# Patient Record
Sex: Male | Born: 1960 | Race: White | Hispanic: No | Marital: Single | Smoking: Former smoker
Health system: Southern US, Community
[De-identification: ages and names within clinical notes are randomized; demographics above are authoritative.]

## PROBLEM LIST (undated history)

## (undated) DIAGNOSIS — F419 Anxiety disorder, unspecified: Secondary | ICD-10-CM

## (undated) DIAGNOSIS — I10 Essential (primary) hypertension: Secondary | ICD-10-CM

## (undated) DIAGNOSIS — L409 Psoriasis, unspecified: Secondary | ICD-10-CM

## (undated) HISTORY — DX: Anxiety disorder, unspecified: F41.9

## (undated) HISTORY — PX: HERNIA REPAIR: SHX51

## (undated) HISTORY — DX: Psoriasis, unspecified: L40.9

---

## 1998-11-29 ENCOUNTER — Ambulatory Visit (HOSPITAL_BASED_OUTPATIENT_CLINIC_OR_DEPARTMENT_OTHER): Admission: RE | Admit: 1998-11-29 | Discharge: 1998-11-29 | Payer: Self-pay | Admitting: Orthopedic Surgery

## 1999-12-16 ENCOUNTER — Emergency Department (HOSPITAL_COMMUNITY): Admission: EM | Admit: 1999-12-16 | Discharge: 1999-12-16 | Payer: Self-pay | Admitting: Emergency Medicine

## 2001-04-07 ENCOUNTER — Ambulatory Visit (HOSPITAL_COMMUNITY): Admission: RE | Admit: 2001-04-07 | Discharge: 2001-04-07 | Payer: Self-pay | Admitting: General Practice

## 2001-04-07 ENCOUNTER — Encounter: Payer: Self-pay | Admitting: General Practice

## 2003-03-21 ENCOUNTER — Ambulatory Visit (HOSPITAL_COMMUNITY): Admission: RE | Admit: 2003-03-21 | Discharge: 2003-03-21 | Payer: Self-pay | Admitting: Orthopedic Surgery

## 2003-03-21 ENCOUNTER — Encounter: Payer: Self-pay | Admitting: Orthopedic Surgery

## 2004-10-04 ENCOUNTER — Emergency Department (HOSPITAL_COMMUNITY): Admission: EM | Admit: 2004-10-04 | Discharge: 2004-10-04 | Payer: Self-pay | Admitting: *Deleted

## 2004-10-11 ENCOUNTER — Ambulatory Visit: Payer: Self-pay | Admitting: Internal Medicine

## 2006-07-30 ENCOUNTER — Emergency Department (HOSPITAL_COMMUNITY): Admission: EM | Admit: 2006-07-30 | Discharge: 2006-07-30 | Payer: Self-pay | Admitting: Emergency Medicine

## 2006-09-01 ENCOUNTER — Inpatient Hospital Stay (HOSPITAL_COMMUNITY): Admission: EM | Admit: 2006-09-01 | Discharge: 2006-09-03 | Payer: Self-pay | Admitting: Emergency Medicine

## 2006-09-02 ENCOUNTER — Encounter (INDEPENDENT_AMBULATORY_CARE_PROVIDER_SITE_OTHER): Payer: Self-pay | Admitting: Cardiology

## 2008-07-11 ENCOUNTER — Inpatient Hospital Stay (HOSPITAL_COMMUNITY): Admission: EM | Admit: 2008-07-11 | Discharge: 2008-07-12 | Payer: Self-pay | Admitting: Emergency Medicine

## 2008-07-11 ENCOUNTER — Encounter (INDEPENDENT_AMBULATORY_CARE_PROVIDER_SITE_OTHER): Payer: Self-pay | Admitting: Neurology

## 2008-07-11 ENCOUNTER — Ambulatory Visit: Payer: Self-pay | Admitting: Internal Medicine

## 2009-08-19 ENCOUNTER — Emergency Department (HOSPITAL_COMMUNITY): Admission: EM | Admit: 2009-08-19 | Discharge: 2009-08-19 | Payer: Self-pay | Admitting: Emergency Medicine

## 2010-08-04 ENCOUNTER — Observation Stay (HOSPITAL_COMMUNITY)
Admission: EM | Admit: 2010-08-04 | Discharge: 2010-08-04 | Payer: Self-pay | Source: Home / Self Care | Attending: Emergency Medicine | Admitting: Emergency Medicine

## 2010-09-15 ENCOUNTER — Encounter: Payer: Self-pay | Admitting: Family Medicine

## 2010-11-04 LAB — POCT I-STAT, CHEM 8
Calcium, Ion: 1.17 mmol/L (ref 1.12–1.32)
Chloride: 103 mEq/L (ref 96–112)
Creatinine, Ser: 1.1 mg/dL (ref 0.4–1.5)
HCT: 45 % (ref 39.0–52.0)
Sodium: 142 mEq/L (ref 135–145)
TCO2: 30 mmol/L (ref 0–100)

## 2010-11-04 LAB — URINALYSIS, ROUTINE W REFLEX MICROSCOPIC
Bilirubin Urine: NEGATIVE
Glucose, UA: NEGATIVE mg/dL
Leukocytes, UA: NEGATIVE
Specific Gravity, Urine: 1.015 (ref 1.005–1.030)
pH: 6 (ref 5.0–8.0)

## 2010-11-04 LAB — URINE MICROSCOPIC-ADD ON

## 2010-11-25 LAB — POCT CARDIAC MARKERS
CKMB, poc: 3.9 ng/mL (ref 1.0–8.0)
Myoglobin, poc: 122 ng/mL (ref 12–200)
Myoglobin, poc: 168 ng/mL (ref 12–200)
Troponin i, poc: <0.05 ng/mL (ref 0.00–0.09)
Troponin i, poc: <0.05 ng/mL (ref 0.00–0.09)

## 2010-11-25 LAB — LIPASE, BLOOD: Lipase: 33 U/L (ref 11–59)

## 2010-11-25 LAB — COMPREHENSIVE METABOLIC PANEL
AST: 54 U/L — ABNORMAL HIGH (ref 0–37)
Creatinine, Ser: 1.18 mg/dL (ref 0.4–1.5)

## 2010-11-25 LAB — CBC: MCV: 87 fL (ref 78.0–100.0)

## 2011-01-07 NOTE — Consult Note (Signed)
NAME:  George Gallegos, George Gallegos NO.:  [id number]   MEDICAL RECORD NO.:  [id number]          PATIENT TYPE:  INP   LOCATION:  3743                         FACILITY:  [facility]   PHYSICIAN:  Gustavus Messing. Orlin Hilding, M.D.DATE OF BIRTH:  1961-05-01   DATE OF CONSULTATION:  07/11/2008  DATE OF DISCHARGE:                                 CONSULTATION   REASON FOR CONSULTATION:  Right-sided flushing/numbness.   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 on July 11, 2008, when he felt  sudden right-sided flushing in his right cheek all the way down the  right side of his body to his foot.  He did notice some mild weakness in  his right foot initially.  However, he did not have dysarthria,  dysphasia, visual defects, headache, dizziness, palpitations, or chest  pain during this time.  The patient denies any recent trauma and has not  had any cervical manipulation.  At the time of his evaluation, the  patient still felt different in his right lower extremity;  specifically he endorsed decreased sensation in his right lower  extremity.   PRIMARY MD:  Lillia Carmel, MD   PAST MEDICAL HISTORY:  1. Status post inguinal hernia repair.  2. Status post knee arthroscopy.  3. Atypical chest pain in January 2008.  Normal heart catheterization      with normal left ventricular function.  4. 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  5. Hypertension, diagnosed about 1 year ago by his primary care      physician, the patient not taking the medication that was      prescribed.   MEDICATIONS:  None.   ALLERGIES:  The patient endorses a non-anaphylactic reaction to an  antibiotic that he cannot recall.   FAMILY HISTORY:  His parents are  both alive and healthy.  He has no  family history of stroke, coronary artery disease, hypertension, or  diabetes.   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  He is married, lives in [place], and has 3 healthy  children.  He smokes about 4 cigars per year.  Drinks 5 glasses of  cabernet per week and denies any illicit drug use.   REVIEW OF SYSTEMS:  NEURO:  No history of headache or visual changes.  The patient explains that he gets very infrequent numbness or pain in  his right lower extremity secondary to his lumbar radiculopathy.  His  back pain associated symptoms have never involved any flushing of his  face or hemibody.  CARDIOVASCULAR:  No chest pain, palpitations, or  syncope.  PULMONARY:  No dyspnea, cough, or wheezing.  GI:  No nausea,  vomiting, abdominal pain, diarrhea, or rectal bleeding.  URINARY:  No  dysuria or hematuria.  MUSCULOSKELETAL:  No joint pain or swelling.  HEMATOLOGIC:  No weight loss, bruising, or bleeding.  ENDOCRINE:  No  polyuria, polydipsia, or polyphagia.   PHYSICAL EXAMINATION:  VITAL SIGNS:  Blood pressure 170/84, heart rate  88, respiratory rate 17, and O2 saturation 100% on room air.  GENERAL:  Middle-aged man in no acute distress.  HEENT:  Oropharynx is clear.  Uvula midline.  Tongue non-deviated.  NECK:  Supple.  No lymphadenopathy, thyromegaly, masses, or carotid  bruit.  LUNGS:  Clear to auscultation bilaterally with good air movement.  CARDIOVASCULAR:  Regular rate and rhythm.  No murmurs, rubs, or gallops.  ABDOMEN:  Bowel sounds positive.  Abdomen is soft, nontender, and  nondistended.  SKIN:  Warm and dry.  No lesions.  NEURO:  The patient is awake, alert, and oriented x3.  Cranial nerves II  through XII intact symmetrically.  Strength 5/5 in upper extremities and  lower extremities symmetrically.  Deep tendon reflexes 2+ throughout.  Minimal decrease in light touch and pain sensation in the  right calf  (this has improved since the patient was first evaluated).  Finger-to-  nose and heel-to-shin did not reveal any ataxia.  Gait not assessed.  NIH stroke scale 1.   LABORATORY DATA:  White blood count 7.9, hemoglobin 16.9 with an MCV of  87 and RDW of 13, and platelets 224.  PT 13.2, INR 1.0, and PTT 30.   IMAGING:  Head CT scan negative.   ASSESSMENT AND PLAN:  This is a 50 year old man with hypertension who  presented with a resolving stroke like symptoms.  Most likely, his  symptoms are related to a transient ischemic attack event.  Another  possibility would be multiple sclerosis, but at this time, we will work  him up as a TIA.  The patient will be admitted by a hospitalist service  and the stroke team will follow up with his results.  A brain and neck  MRI and MRA were ordered.  We will obtain a 2-D echo to rule out a  cardiac embolic etiology.  If we are unable to visualize the neck  vessels well, carotid Dopplers will be ordered.  For further risk factor  stratification, we will obtain a fasting lipid profile and a fasting  homocysteine level.  We will start the patient on aspirin.  He will need  blood pressure control as an outpatient, but it is important not to  emergently drop his blood pressure at this time given the possibility of  an acute event.  The patient does not meet the criteria for TPA given  that his symptoms were too mild and that they rapidly removed.      Olene Craven, M.D.  Electronically Signed      Gustavus Messing. Orlin Hilding, M.D.  Electronically Signed    MC/MEDQ  D:  07/11/2008  T:  07/11/2008  Job:  [id number]

## 2011-01-10 NOTE — Consult Note (Signed)
NAME:  George Gallegos, MOLDER NO.:  [id number]   MEDICAL RECORD NO.:  [id number]          PATIENT TYPE:  INP   LOCATION:  2623                         FACILITY:  [facility]   PHYSICIAN:  Georga Hacking, M.D.DATE OF BIRTH:  [birth year]   DATE OF CONSULTATION:  09/02/2006  DATE OF DISCHARGE:                                 CONSULTATION   I was asked to see this 50 year old male by Dr. Elliot Cousin for  evaluation of chest discomfort.  The patient has previously been in good  health but has had significant low back pain and lumbar disc disease.  He has been under significant situational stress over the past several  months, mainly related to business concerns.  About a month ago, he  experienced substernal chest pressure that was precipitated by emotional  events and was seen in the [facility] Emergency Room.  He has continued  to have substernal pressure that he relates to emotional upset or other  events.  The symptoms have occurred on and off over the past month, but  has escalated recently.  He had seen Dr. Prince Rome several days ago because  of low back pain and was treated conservatively and mentioned the chest  discomfort to him and was scheduled to see me electively.  He was mildly  hypertensive on admission.  He had a prolonged episode of chest  discomfort the day of admission, yesterday while at work, that was  precipitated by emotional upset.  He was admitted to the hospital and  had some transient ST depression on an EKG done earlier this morning and  last night that has since resolved.  The pain is not exertional in  nature although he has been inactive recently.   His past history is remarkable for recently diagnosed hypertension.  His  lipid status is unknown.  He has a history of possible irritable bowel  syndrome.   PAST SURGICAL HISTORY:  Knee arthroscopy and hernia surgery.   ALLERGIES:  None.   CURRENT MEDICATIONS:  None, although he is currently on  aspirin.   FAMILY HISTORY:  His father and mother are both living with no premature  history of heart disease.  He has two brothers and a sister who are  living with no significant cardiac history.   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  He is a nonsmoker and does not use alcohol to  excess.  He is married with three children.   REVIEW OF SYMPTOMS:  He describes himself as a highly anxious person and  has been this way most of his life.  He describes intense situational  stress and anxiety that will precipitate pressure and tachycardia.  He  has no skin problems.  He has no HEENT problems except for occasional  sinus drainage.  He denies dyspnea, cough, wheezing, or hemoptysis.  He  has no history of  reflux, ulcer, hematochezia or diarrhea.  There are no  urinary problems.  He has very mild arthritis. He has no history of  headaches or stroke.   PHYSICAL EXAMINATION:  GENERAL:  He is an anxious tall male who is currently in no acute  distress.  VITAL SIGNS:  Blood pressure 140/80, pulse was 70.  SKIN:  Warm and dry.  HEENT:  Unremarkable.  LUNGS:  Clear to A&P.  CARDIOVASCULAR:  Normal S1 and S2, there is no S3, S4, or murmur.  ABDOMEN:  Soft, nontender, no mass, hepatosplenomegaly, or aneurysm.  EXTREMITIES:  Femoral and distal pulses are 2+.   12 lead EKG showed minor ST depression in the anterior leads on  admission that has resolved by EKG this afternoon.  His chemistry panel  was normal.  CPK total 244 with MB and troponins negative.   IMPRESSION:  1. Chest discomfort with some atypical features, rule out coronary      artery disease.  2. Anxiety.  3. Hypertension.   RECOMMENDATIONS:  The patient is highly anxious but has had continued  chest discomfort with emergency  room visits for chest pain in February  2006 and December 6.  I discussed the options for workup with the  patient and his wife.  We discussed whether to have cardiac  catheterization for definitive diagnosis of coronary artery disease  versus non-invasive stress testing with profusion imaging.  I discussed  the benefits and risks of each and because of the patient's anxiety and  multiple emergency room visits for chest discomfort, we have decided to  go ahead with catheterization.  The procedure was discussed with him  fully including risks of myocardial infarction, stroke, or death, and he  is agreeable to proceed.  The possibility of same day stenting or  intervention was discussed with him at the same time and will be done by  a colleague if necessary.      Georga Hacking, M.D.  Electronically Signed     WST/MEDQ  D:  09/02/2006  T:  09/02/2006  Job:  [id number]   cc:   Lillia Carmel, M.D.  Elliot Cousin, M.D.

## 2011-01-10 NOTE — Discharge Summary (Signed)
George Gallegos, George Gallegos                ACCOUNT NO.:  [id number]   MEDICAL RECORD NO.:  [id number]          PATIENT TYPE:  INP   LOCATION:  2623                         FACILITY:  [facility]   PHYSICIAN:  Hind I Elsaid, MD      DATE OF BIRTH:  Dec 25, 1960   DATE OF ADMISSION:  09/01/2006  DATE OF DISCHARGE:  09/03/2006                               DISCHARGE SUMMARY   PRIMARY CARE PHYSICIAN:  Unassigned.   DISCHARGE DIAGNOSIS:  Atypical chest pain status post cardiac cath with  the result  negative.   DISCHARGE MEDICATIONS:  Protonix 40 mg p.o. daily.   PROCEDURES:  1. X-ray on January 8:  No active cardiopulmonary disease.  2. January 10:  Left heart catheterization with coronary angiogram and      ventriculogram with the result that mitral valve was normal.  Left      ventricle appears normal in size.  Ejection fraction 70%.  Coronary      arteries arise and they appear normal.  There is no complication in      the coronaries noted.  Left main coronary artery is normal.  Left      anterior descending is large vessel that extends around the apex      and a single diagonal branch with 2 smaller diagonal branches      noted.  There is no disease noted.  Circumflex coronary artery has      the terminal branch and free of disease.  The right coronary artery      is a large, dominant vessel containing posterior descending,      posterior lateral branch and is free of disease.   CONSULTATIONS:  Cardio consult done be Dr. Viann Fish.   HISTORY OF PRESENT ILLNESS:  A 50 year old male with no significant past  medical history who presented on January 8 who has had chest pressure in  the mid sternum relieved by rest and responsive to nitroglycerin  sublingual.  The patient was admitted to telemetry.  Cardiac enzymes, CK-  MB, and troponin, and echo were ordered.  Cardiology was consulted by  Dr. Donnie Aho where he recommended cardiac cath.  The patient underwent  cardiac cath with the result  above, which is normal.  The patient has no  complications as per the cardiac cath.  There is no hematoma at the cath  site, and basilar pulses are intact.  So the patient has no evidence of  coronary artery disease with normal left ventricular ejection fraction.  I suspect that the pain may lead you to reflux or anxiety, so we will  discharge the patient on Protonix 40 mg p.o. daily and to follow with  the primary care physician for further recommendations if the chest pain  continues.      Hind Bosie Helper, MD  Electronically Signed     HIE/MEDQ  D:  09/03/2006  T:  09/04/2006  Job:  [id number]

## 2011-01-10 NOTE — H&P (Signed)
NAME:  George Gallegos, George Gallegos NO.:  [id number]   MEDICAL RECORD NO.:  [id number]          PATIENT TYPE:  INP   LOCATION:  2623                         FACILITY:  [facility]   PHYSICIAN:  Gardiner Barefoot, MD    DATE OF BIRTH:  Mar 29, 1961   DATE OF ADMISSION:  09/01/2006  DATE OF DISCHARGE:                              HISTORY & PHYSICAL   PRIMARY CARE PHYSICIAN:  Unassigned.   HISTORY OF PRESENT ILLNESS:  George Gallegos, a 50 year old male, with no  significant past medical history, who presents today with chest pressure  in mid sternum, relieved both by rest or along with activity that has  been coming and going for the last month.  Of note, he came to the  emergency room about 1 month ago for the same and it was recommended  that he follow up with his physician, but he failed to do so.  Patient  reports that both of these episodes came at times of increased stress,  which has exacerbated this.  The patient states that he has poor living  situation and has had extreme amounts of stress, for which he holds it  in.  The patient denies any smoking or cocaine use.   PAST MEDICAL HISTORY:  No know past medical history and no medicines.   SOCIAL HISTORY:  Patient lives in an apartment and denies any smoking,  drinking or drugs.   FAMILY HISTORY:  No early cardiac events in the family.   ALLERGIES:  NO KNOWN DRUG ALLERGIES.   REVIEW OF SYSTEMS:  A 12-point review of systems was obtained and was  negative other than in history of present illness.   PHYSICAL EXAMINATION:  VITAL SIGNS:  Temperature is 98.3.  Heart rate is  89.  Blood pressure is 171/86.  Respirations 18.  Oxygen is 97% on room  air.  GENERAL:  The patient is anxious, alert and appears in no acute  distress.  HEENT:  Anicteric.  Mucous membranes moist.  CARDIOVASCULAR:  Regular rate and rhythm with no murmurs, rubs or  gallops.  LUNGS:  Clear to auscultation bilaterally.  ABDOMEN:  Soft, nontender, nondistended.   Positive bowel sounds.  No  hepatosplenomegaly.  EXTREMITIES:  No clubbing, cyanosis or edema.   EKG with nonspecific T-inversion in 1 lead.   LABS:  Initial troponin is less than 0.05, CK-MB is 1.7, creatinine is  1.1, sodium is 138, potassium 3.9, chloride 105, glucose 95, BUN 12.   ASSESSMENT/PLAN:  1. Atypical chest pressure.  Unsure if this is cardiac in origin or      more likely feel it is related to the patient's anxiety as it is      noticeably relieved as I was telling the patient and reassuring him      that we have no evidence for heart attack at the present time.      Patient admits that he has significant stress and has very poor      coping skills with this stress.  Nevertheless, as the patient is      having chest pain, we  will have the patient ruled out overnight and      monitor him on tele and also we will do a stress test tomorrow.      The patient was started on a beta blocker recently, but has not      filled his prescription and will hold on the beta blocker tonight      as we want to check a stress test tomorrow and get his heart rate      up at that time.  2. Anxiety.  The patient would certainly benefit from some outpatient      psychotherapy and will need a referral for that.  3. Disposition.  The patient is a full code and will need to be      referred to a primary care physician.      Gardiner Barefoot, MD  Electronically Signed     RWC/MEDQ  D:  09/01/2006  T:  09/02/2006  Job:  [id number]

## 2011-01-10 NOTE — Cardiovascular Report (Signed)
NAME:  George Gallegos, George Gallegos NO.:  [id number]   MEDICAL RECORD NO.:  [id number]          PATIENT TYPE:  INP   LOCATION:  2623                         FACILITY:  [facility]   PHYSICIAN:  Georga Hacking, M.D.DATE OF BIRTH:  1961/02/06   DATE OF PROCEDURE:  09/03/2006  DATE OF DISCHARGE:                            CARDIAC CATHETERIZATION   PROCEDURE:  Left heart catheterization with coronary angiograms and left  ventriculogram.   HISTORY:  A 50 year old male admitted with recurrent substernal pressure  precipitated by emotional events.  He had bradycardia and hypotension  the night prior to the catheterization also in association with chest  discomfort that may have been vagal in origin.   COMMENTS ABOUT THE PROCEDURE:  The procedure was done through the right  femoral artery using a single anterior needle wall stick with 6-French  catheters over a 6-French sheath.  He tolerated the procedure well  without any complications.  Had good hemostasis, peripheral pulses noted  at the end of the procedure.   HEMODYNAMIC DATA:  Aorta post contrast 130/76, LV postcontrast 130/612.   ANGIOGRAPHIC DATA:  Left ventriculogram:  Performed in the 30 degrees  RAO projection.  The aortic valve is normal.  The mitral valve was  normal.  The left ventricle appears normal in size.  Estimated ejection  fraction is 70%.  Coronary arteries arise and distribute normally.  There is no calcification in the coronaries noted.  The left main  coronary artery is normal.  The left anterior descending is a large  vessel that extends around the apex and supplies a single diagonal  branch with two smaller diagonal branches noted.  There is no disease  noted.  Circumflex coronary artery has a is two marginal branches and is  free of disease.  The right coronary artery is a large dominant vessel  containing posterior descending posterolateral branch and is free of  disease.   IMPRESSION:  1. Normal  coronary arteries.  2. Normal left ventricular function.   RECOMMENDATIONS:  Evaluation for other sources of chest discomfort.  Treatment of anxiety.  Consideration of treatment for reflux.  Check  echocardiogram.      Georga Hacking, M.D.  Electronically Signed     WST/MEDQ  D:  09/03/2006  T:  09/03/2006  Job:  [id number]   cc:   Lillia Carmel, M.D.  Elliot Cousin, M.D.

## 2011-01-10 NOTE — Discharge Summary (Signed)
NAME:  George Gallegos, George Gallegos NO.:  [id number]   MEDICAL RECORD NO.:  [id number]          PATIENT TYPE:  INP   LOCATION:  3743                         FACILITY:  [facility]   PHYSICIAN:  Chauncey Reading, D.O.  DATE OF BIRTH:  1960-12-24   DATE OF ADMISSION:  07/11/2008  DATE OF DISCHARGE:  07/12/2008                               DISCHARGE SUMMARY   DISCHARGE DIAGNOSES:  1. Transient ischemic attack, resolved.  2. Chronic low back pain secondary to L5-S1 disk herniation.  3. Borderline hypertension.  4. Clean cardiac catheterization in January 2008, showing ejection      fraction of 70% and normal coronaries.   DISCHARGE MEDICATIONS:  1. Aspirin 81 mg p.o. daily.  2. Fish oil tablets as previously instructed.   DISPOSITION AND FOLLOWUP:  The patient was discharged home in stable  condition with no residual neurological deficits.  The patient is to  follow up with his primary care George Gallegos, Dr. Prince Gallegos of [facility] on Wednesday, August 09, 2008, at 8:45 a.m.  At that  point, he should have his cholesterol rechecked.  His blood pressure  should also be assessed and medications added as deemed necessary for  further lowering of his cholesterol and hypertension.  In addition, a  hypercoagulable workup and vasculitis workup was pending upon the  patient's discharge, and this will need to be followed up on as an  outpatient.  Furthermore, the patient is to call Dr. Pearlean Gallegos from  Neurology at [phone] for outpatient followup for his TIA.   PROCEDURES PERFORMED:  1. CT of the head without contrast performed on July 11, 2008,      revealed no visible infarct or hemorrhage.  2. An MRI and MRA of the head and neck without contrast performed on      July 11, 2008, showed normal ventricles, no infarct or mass      lesions, no fluid collection.  The carotid bifurcation is widely      patent bilaterally, and there is no carotid stenosis.  Both      vertebral  arteries are patent without significant stenosis.  3. A 2-D echocardiogram performed on July 11, 2008, revealed      normal left ventricular function with ejection fraction of 60-70%.      Left ventricular wall thickness was mildly increased.  Left      ventricular diastolic function was normal.  The possibility of a      patent foramen ovale could not be excluded based on this study.      There were no significant interval changes when compared to a prior      study performed on August 25, 2006.   CONSULTATIONS:  George P. George Brownie, MD, from Neurology.   BRIEF ADMITTING HISTORY AND PHYSICAL:  George Gallegos is a 50 year old white  male with past medical history of questionable borderline hypertension,  chronic lower back pain secondary to L5-S1 disk herniation, who presents  with right-sided numbness and weakness that started abruptly at 10 a.m.  on the morning of admission while he was working on his computer.  States that symptoms started with a numb feeling extending from the  right side of his face down to his right lower leg, soon followed by  right-sided weakness.  The patient denied any inciting events such as  chest pain, palpitations, shortness of breath, and denies any subsequent  loss of consciousness, headache, vision changes, incontinence, or speech  difficulties.  The patient drove himself to the emergency room and once  he came to the ER, his symptoms gradually resolved.  The patient now  states that he is back to baseline and denies any recent fevers, chills,  cough, abdominal pain, nausea, vomiting, diarrhea, dysuria, or heat or  cold intolerance.   PHYSICAL EXAMINATION:  VITAL SIGNS:  On admission, the patient had the  following vitals; temperature of 97.8, blood pressure of 163/102, pulse  of 92, respiratory rate of 18, and sating 99% on room air.  GENERAL:  He was in no acute distress, was alert and oriented x3.  HEENT:  Extraocular motions to be intact.  Anicteric  sclerae.  Pupils  are equal, round, and reactive to light.  Oropharynx, oral cavity was  clear with moist mucous membranes.  NECK:  Supple with no lymphadenopathy or carotid bruits.  RESPIRATORY:  Clear to auscultation bilaterally.  CARDIOVASCULAR:  Regular rate and rhythm with normal S1 and S2, and no  murmurs, rubs, or gallops.  GI:  Positive bowel sounds and soft, nontender, and nondistended  abdomen.  EXTREMITIES:  No clubbing, cyanosis, or edema and no calf tenderness.  SKIN:  Warm and dry.  NEUROLOGIC:  The patient to be alert and oriented x3.  Cranial nerves II  through XII intact, 5/5 strength and normal sensation to light touch  throughout.  Deep tendon reflexes 2+ and normal cerebellar function.   LABORATORIES ON ADMISSION:  White blood cell count of 7.9 with ANC of  6.2, hemoglobin of 16.9, MCV 87, and platelets 224.  Sodium 141,  potassium 3.8, chloride of 105, bicarb 25, BUN 10, creatinine 1.18, and  glucose 107.  Total bilirubin 1.4, alk phos 48, AST 49, ALT 41, total  protein 7.4, albumin 4.6, and calcium 9.6.  PT 13.2, PTT 30.0, and INR  1.0.  Urine drug screen was negative.  ESR was 4.0.  Total CK was 608,  CK-MB 5.1, and troponin 0.01.   HOSPITAL COURSE:  Transient ischemic attack.  While in the emergency  department, the patient received a CT of the head to rule out any acute  hemorrhages and infarcts.  The patient's symptoms of right-sided  weakness and paresthesias resolved within 30 minutes of reaching the  emergency department, lasting for a total of less than 1 hour and never  recurred throughout the course of his admission.  The patient was  admitted to a telemetry bed for overnight observation and TIA workup.  He received an essentially normal MRI and MRA of the head and neck and  revealed no intracranial lesions or vascular pathology.  A 2-D  echocardiogram was performed, which showed normal ventricular and valve  functioning, and no source of embolic  stroke.  To evaluate for  cerebrovascular risk-factors, a fasting lipid panel and hemoglobin A1c  were obtained.  The lipid panel showed a total cholesterol of 180, LDL  of 119, HDL of 31, and triglycerides of 149.  His hemoglobin A1c was  5.2.  Neurology was consulted and after evaluation of the patient, it  was recommended that he begin daily aspirin therapy as well as over-the-  counter fish oil tablets to help lower his cholesterol levels.  On  admission, the patient's blood pressure was slightly elevated in the  160s.  However, overnight his blood pressure dropped to normal without  any intervention and remained normal throughout the course of his  hospitalization.  Because of the patient's relatively young age and lack  of significant risk factors, a hypercoagulable as well as vasculitis  workup was started in the hospital; however, the results of these were  largely pending upon discharge and will need to be followed up on as an  outpatient.  By the day of discharge, the patient had no more symptoms  of paresthesias or weakness, and was back to his baseline state of  health.   DISCHARGE LABORATORIES AND VITAL SIGNS:  On discharge, the patient had a  temperature of 98.1, heart rate of 60, respirations of 18, blood  pressure of 128/61, and saturation of 99% on room air.   The patient also had the following labs on the day of discharge; ESR of  4.0, CRP of 0.1, serum homocysteine of 10.3, and hemoglobin A1c of 5.2.  ANA was negative.  C3 and C4 levels were within normal limits.  Total  cholesterol was 180, triglycerides 149, HDL of 31, and LDL of 119.  Hypercoagulable panel was pending.      Lucy Antigua, MD  Electronically Signed      Chauncey Reading, D.O.  Electronically Signed    RK/MEDQ  D:  07/14/2008  T:  07/15/2008  Job:  [id number]   cc:   Lillia Carmel, M.D.

## 2011-05-27 LAB — COMPREHENSIVE METABOLIC PANEL
AST: 32
AST: 49 — ABNORMAL HIGH
Albumin: 3.8
Albumin: 4.6
Alkaline Phosphatase: 68
CO2: 25
Calcium: 9.2
Creatinine, Ser: 1.15
Creatinine, Ser: 1.18
GFR calc Af Amer: >60
Glucose, Bld: 107 — ABNORMAL HIGH
Potassium: 3.8
Sodium: 141
Total Protein: 6.6

## 2011-05-27 LAB — HEMOGLOBIN A1C: Mean Plasma Glucose: 103

## 2011-05-27 LAB — CK TOTAL AND CKMB (NOT AT ARMC)
CK, MB: 5.1 — ABNORMAL HIGH
Relative Index: 0.8
Relative Index: 1
Total CK: 608 — ABNORMAL HIGH

## 2011-05-27 LAB — DIFFERENTIAL
Basophils Absolute: 0
Eosinophils Relative: 0
Lymphs Abs: 1.2
Neutrophils Relative %: 78 — ABNORMAL HIGH

## 2011-05-27 LAB — CBC
HCT: 48.5
Hemoglobin: 16.9
MCHC: 34.9
MCHC: 35.3
MCV: 86.6
MCV: 87
Platelets: 193
Platelets: 224
RDW: 12.8
WBC: 7.6
WBC: 7.9

## 2011-05-27 LAB — BETA-2-GLYCOPROTEIN I ABS, IGG/M/A
Beta-2 Glyco I IgG: 5 U/mL (ref <20)
Beta-2-Glycoprotein I IgA: <4 U/mL (ref <10)

## 2011-05-27 LAB — RAPID URINE DRUG SCREEN, HOSP PERFORMED
Opiates: NOT DETECTED
Tetrahydrocannabinol: NOT DETECTED

## 2011-05-27 LAB — TROPONIN I
Troponin I: 0.01
Troponin I: 0.01

## 2011-05-27 LAB — C4 COMPLEMENT: Complement C4, Body Fluid: 39

## 2011-05-27 LAB — LUPUS ANTICOAGULANT PANEL
DRVVT: 47.7 — ABNORMAL HIGH (ref 36.1–47.0)
Lupus Anticoagulant: NOT DETECTED
dRVVT Incubated 1:1 Mix: 39.9 (ref 36.1–47.0)

## 2011-05-27 LAB — LIPID PANEL
HDL: 31 — ABNORMAL LOW
LDL Cholesterol: 119 — ABNORMAL HIGH
Triglycerides: 149
VLDL: 30

## 2011-05-27 LAB — C3 COMPLEMENT: C3 Complement: 122

## 2011-05-27 LAB — PROTEIN C ACTIVITY: Protein C Activity: 144 % — ABNORMAL HIGH (ref 75–133)

## 2011-05-27 LAB — PROTEIN C, TOTAL: Protein C, Total: 97 % (ref 70–140)

## 2011-05-27 LAB — FACTOR 5 LEIDEN

## 2011-05-27 LAB — C-REACTIVE PROTEIN: CRP: 0.1 — ABNORMAL LOW (ref <0.6)

## 2011-05-27 LAB — HOMOCYSTEINE: Homocysteine: 11.8

## 2011-05-27 LAB — CARDIOLIPIN ANTIBODIES, IGG, IGM, IGA: Anticardiolipin IgG: <7 — ABNORMAL LOW (ref <11)

## 2014-12-13 ENCOUNTER — Other Ambulatory Visit: Payer: Self-pay | Admitting: Orthopedic Surgery

## 2014-12-13 DIAGNOSIS — M25562 Pain in left knee: Secondary | ICD-10-CM

## 2014-12-19 ENCOUNTER — Ambulatory Visit
Admission: RE | Admit: 2014-12-19 | Discharge: 2014-12-19 | Disposition: A | Discharge status: Home / Self Care | Payer: BLUE CROSS/BLUE SHIELD | Source: Ambulatory Visit | Attending: Orthopedic Surgery | Admitting: Orthopedic Surgery

## 2014-12-19 DIAGNOSIS — M25562 Pain in left knee: Secondary | ICD-10-CM

## 2016-03-12 IMAGING — MR MR KNEE*L* W/O CM
4 of 6 series · 18 of 40 positions shown · non-contrast
Comparison: Report only from previous MRI 04/07/2001.

CLINICAL DATA: Left knee pain following lifting injury 3 weeks ago.
History of knee surgery in 4333. Initial encounter.

EXAM:
MRI OF THE LEFT KNEE WITHOUT CONTRAST
TECHNIQUE: Multiplanar, multisequence MR imaging of the knee was performed. No
intravenous contrast was administered.

[Series 3: PD · axial · 4.0mm · 0.31mm/px · z∈[-59,+61]mm · 9 of 26 slices shown (1 of 2)]
[im 1/26]
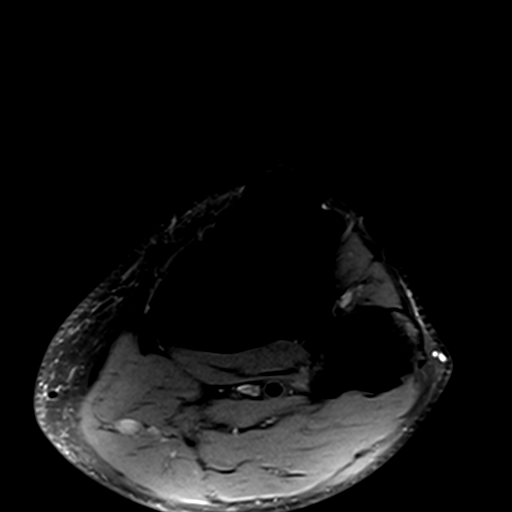
[im 4/26]
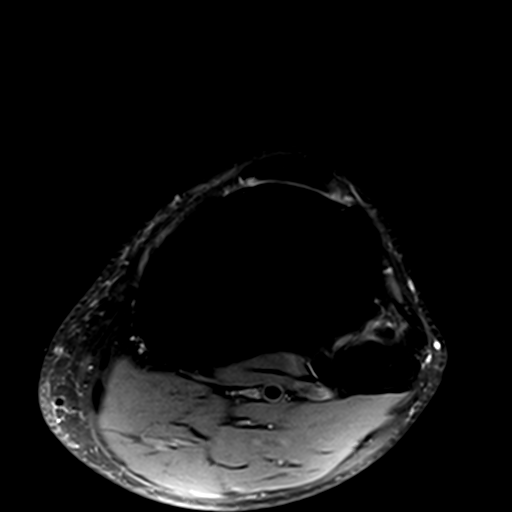
[im 7/26]
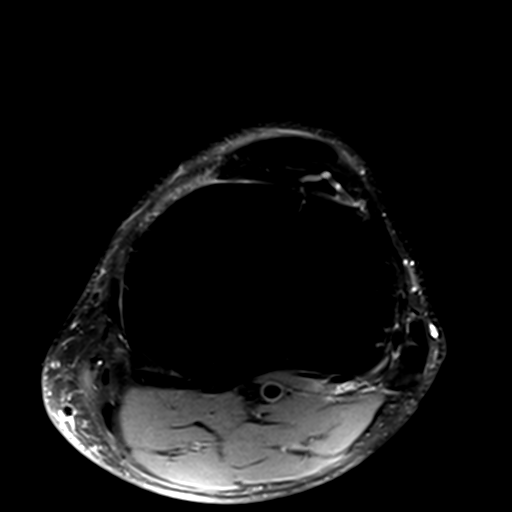
[im 10/26]
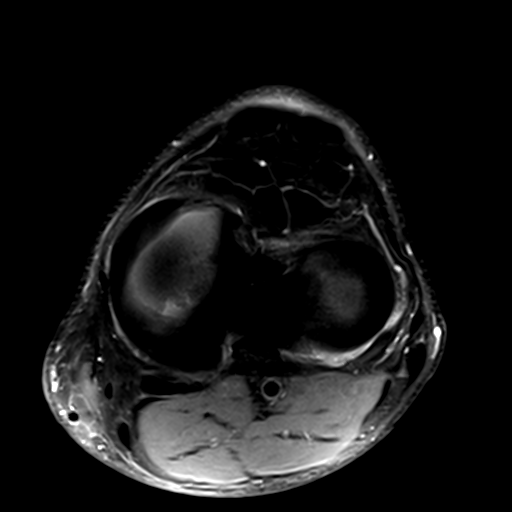
[im 13/26]
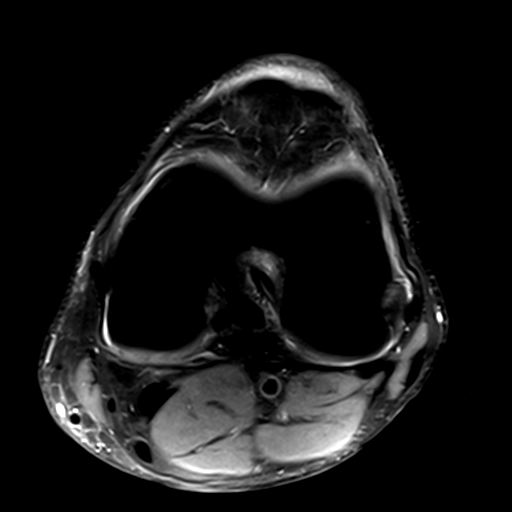
[im 16/26]
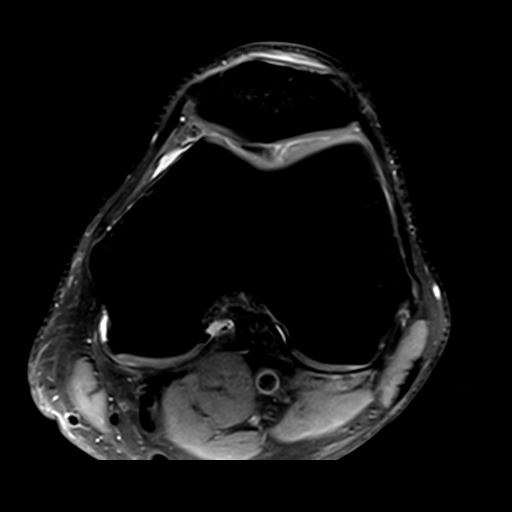
[im 19/26]
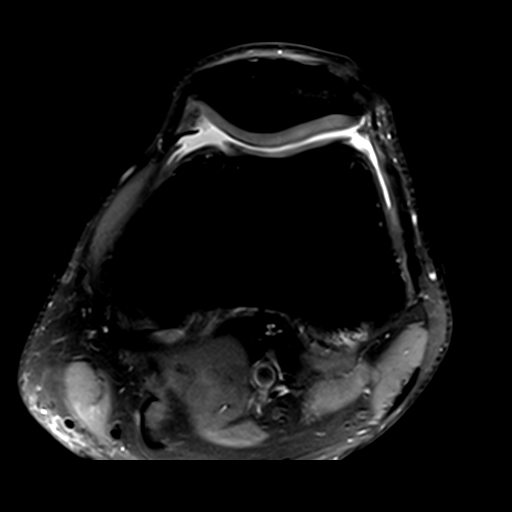
[im 22/26]
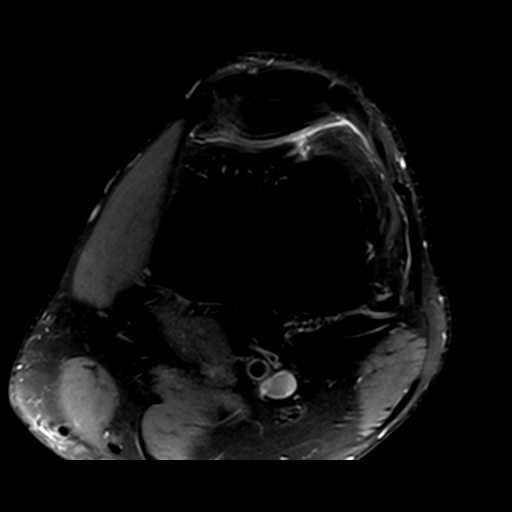
[im 26/26]
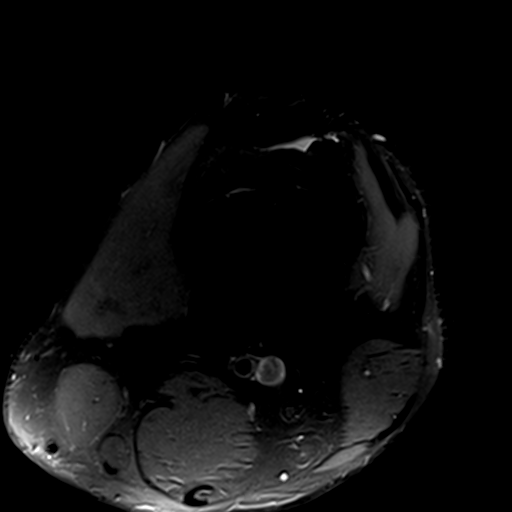

[Series 4: PD · coronal · 4.0mm · 0.31mm/px · 3 of 22 slices shown (2 of 2)]
[im 4/22]
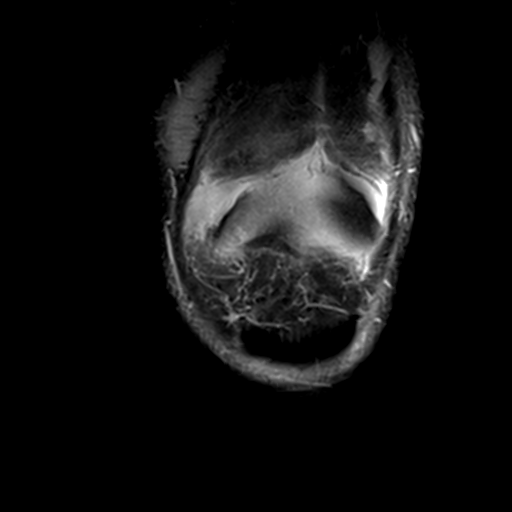
[im 11/22]
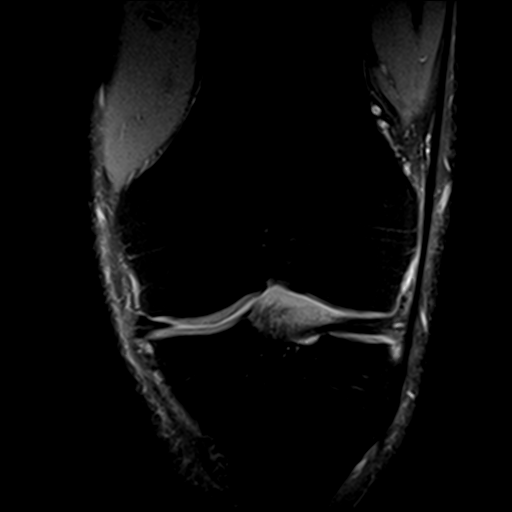
[im 18/22]
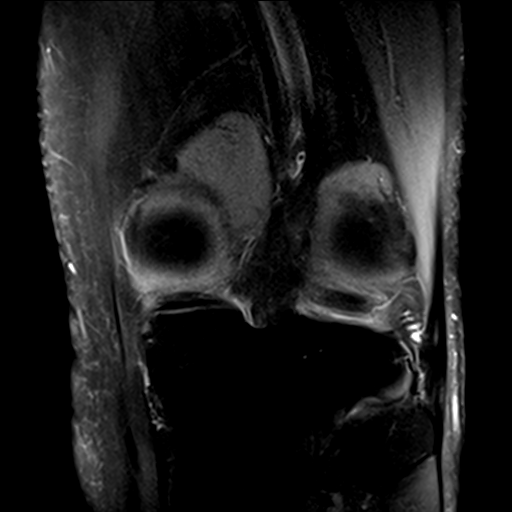

[Series 5: T2 · coronal · 4.0mm · 0.31mm/px · 3 of 22 slices shown]
[im 4/22]
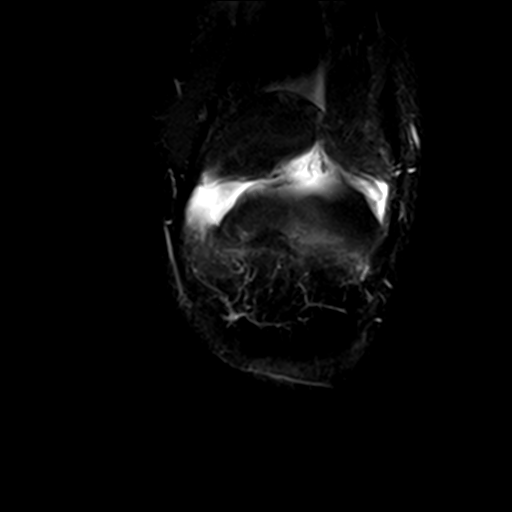
[im 11/22]
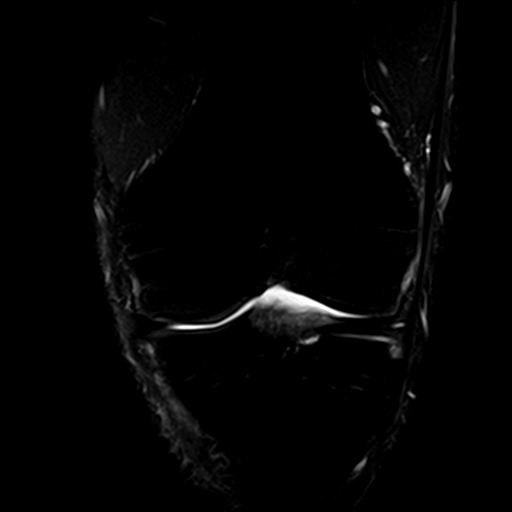
[im 18/22]
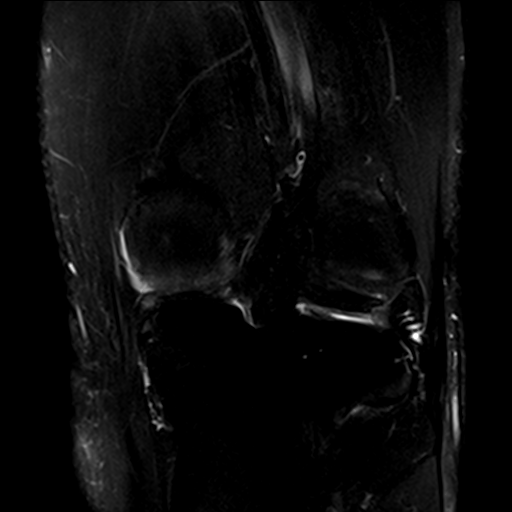

[Series 6: PD fat-sat · sagittal · 4.0mm · 0.31mm/px · 3 of 23 slices shown]
[im 4/23]
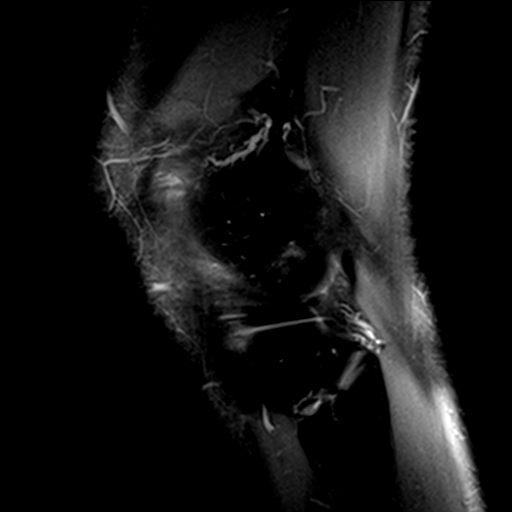
[im 12/23]
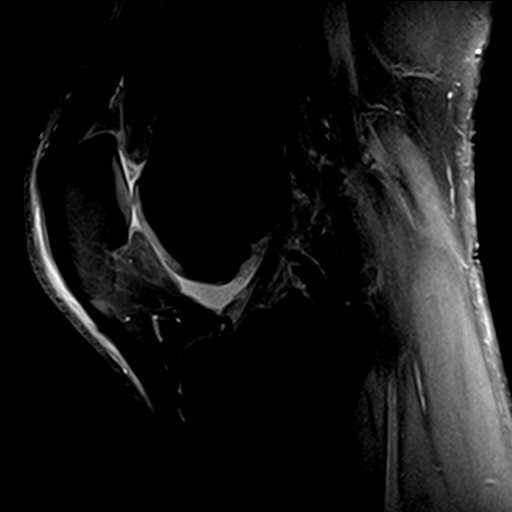
[im 19/23]
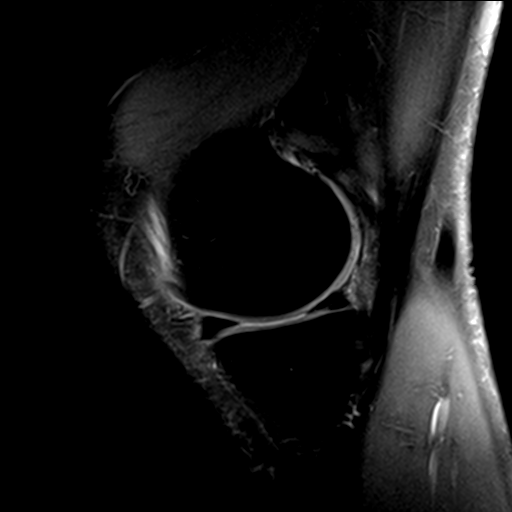

[18 of 40 positions shown; findings below may reference images not displayed]

FINDINGS: MENISCI

Medial meniscus: There is some free edge and undersurface
irregularity of the posterior horn which was described previously
and may be postsurgical in etiology or reflect a small tear. There
is no displaced meniscal fragment. The meniscal root is intact.

Lateral meniscus: Slightly discoid configuration. There is a tiny
focus of free edge irregularity at the junction of the anterior horn
and body, best seen on sagittal image 18 and coronal image 9. No
displaced meniscal fragment.

LIGAMENTS

Cruciates:  Intact.

Collaterals:  Intact.

CARTILAGE

Patellofemoral: The patellar cartilage is well preserved. There is
mild fissuring of the trochlear cartilage.

Medial: Mild chondral thinning and surface irregularity posteriorly
over the medial femoral condyle. No full-thickness defect.

Lateral:  Preserved.

Joint:  Minimal joint fluid.  No loose bodies observed.

Popliteal Fossa:  Unremarkable. No significant Baker's cyst.

Extensor Mechanism: Intact. There is mild prepatellar subcutaneous
edema. There is a mildly thickened medial patellar plica.

Bones:  No significant extra-articular osseous findings.
IMPRESSION: 1. Free edge irregularity of both menisci, similar to previous
report and potentially postsurgical in etiology. There is a possible
small undersurface tear of the posterior horn of the medial meniscus
which was also previously described. No displaced meniscal fragment.
2. Mild medial compartment degenerative chondrosis and fissuring of
the trochlear cartilage. No acute osseous findings.
3. Intact knee ligaments.

## 2016-11-19 ENCOUNTER — Encounter (HOSPITAL_BASED_OUTPATIENT_CLINIC_OR_DEPARTMENT_OTHER): Payer: Self-pay

## 2016-11-19 ENCOUNTER — Emergency Department (HOSPITAL_BASED_OUTPATIENT_CLINIC_OR_DEPARTMENT_OTHER)
Admission: EM | Admit: 2016-11-19 | Discharge: 2016-11-19 | Disposition: A | Discharge status: Home / Self Care | Payer: 59 | Attending: Emergency Medicine | Admitting: Emergency Medicine

## 2016-11-19 DIAGNOSIS — Y929 Unspecified place or not applicable: Secondary | ICD-10-CM | POA: Diagnosis not present

## 2016-11-19 DIAGNOSIS — Y939 Activity, unspecified: Secondary | ICD-10-CM | POA: Diagnosis not present

## 2016-11-19 DIAGNOSIS — S0180XA Unspecified open wound of other part of head, initial encounter: Secondary | ICD-10-CM

## 2016-11-19 DIAGNOSIS — Y999 Unspecified external cause status: Secondary | ICD-10-CM | POA: Diagnosis not present

## 2016-11-19 DIAGNOSIS — I10 Essential (primary) hypertension: Secondary | ICD-10-CM | POA: Insufficient documentation

## 2016-11-19 DIAGNOSIS — X58XXXA Exposure to other specified factors, initial encounter: Secondary | ICD-10-CM | POA: Diagnosis not present

## 2016-11-19 DIAGNOSIS — Z87891 Personal history of nicotine dependence: Secondary | ICD-10-CM | POA: Insufficient documentation

## 2016-11-19 DIAGNOSIS — Z79899 Other long term (current) drug therapy: Secondary | ICD-10-CM | POA: Diagnosis not present

## 2016-11-19 DIAGNOSIS — S0993XA Unspecified injury of face, initial encounter: Secondary | ICD-10-CM | POA: Diagnosis present

## 2016-11-19 HISTORY — DX: Essential (primary) hypertension: I10

## 2016-11-19 NOTE — Discharge Instructions (Signed)
Please return if you develop any of the discussed symptoms or have any concerns.

## 2016-11-19 NOTE — ED Triage Notes (Signed)
Pt reports that initially he thought he cut himself shaving, but then he saw another wound next to the area that made him concern for some type of bite. Occurred 9-10 days ago. Pt states he hikes in the woods at night.   Wound has since healed up. Pt concerned for rabies from a bat bite.

## 2016-11-19 NOTE — ED Provider Notes (Signed)
[place] DEPT [facility] Provider Note   CSN: [id number] Arrival date & time: 11/19/16  1723  By signing my name below, I, George Gallegos, attest that this documentation has been prepared under the direction and in the presence of George Gallegos, [place]. Electronically Signed: Dora Gallegos, Scribe. 11/19/2016. 6:44 PM.  History   Chief Complaint Chief Complaint  Patient presents with  . Wound Check    The history is provided by the patient. No language interpreter was used.     HPI Comments: George Gallegos is a 56 y.o. male with PMHx of HTN who presents to the Emergency Department for evaluation of a right cheek wound that has been present for 9-10 days. He states he shaved over a "bump" on his right cheek 9-10 days ago and the area bled minimally. He states he then noticed two small wounds, about one inch apart, on his right cheek that have since healed.  He states he hikes trails at night without any lighting and is concerned his facial bump/resolved puncture wounds may be indicative of rabies; he denies being struck in the face or visualizing any bats during his hikes. Pt denies congestion, sore throat, cough, or any other associated symptoms.  He reports extensively searching his house for a bat but did not find one.  He initially became concerned about the possibility of a bat after searching goggle for punctures and the possibility of bats came up.   Patient later reports that he did not go hiking the night before he noticed the wounds and is now not concerned he may have ran into a bat.  Past Medical History:  Diagnosis Date  . Hypertension     There are no active problems to display for this patient.   Past Surgical History:  Procedure Laterality Date  . HERNIA REPAIR         Home Medications    Prior to Admission medications   Medication Sig Start Date End Date Taking? Authorizing Provider  metoprolol (LOPRESSOR) 50 MG tablet Take 50 mg by mouth 2 (two) times  daily.   Yes Historical Provider, MD    Family History No family history on file.  Social History Social History  Substance Use Topics  . Smoking status: Former Research scientist (life sciences)  . Smokeless tobacco: Never Used  . Alcohol use 4.2 oz/week    7 Standard drinks or equivalent per week     Allergies   Patient has no known allergies.   Review of Systems Review of Systems  Constitutional: Negative for chills and fever.  HENT: Negative for congestion, ear pain and sore throat.   Eyes: Negative for pain and visual disturbance.  Respiratory: Negative for cough and shortness of breath.   Cardiovascular: Negative for chest pain and palpitations.  Gastrointestinal: Negative for abdominal pain and vomiting.  Genitourinary: Negative for dysuria and hematuria.  Musculoskeletal: Negative for arthralgias and back pain.  Skin: Positive for wound. Negative for color change and rash.  Neurological: Negative for seizures and syncope.  All other systems reviewed and are negative.  Physical Exam Updated Vital Signs BP (!) 145/101 (BP Location: Left Arm) Comment: Did not take bp med today.  Pulse 71   Temp 98.1 F (36.7 C) (Oral)   Resp 16   Ht 6\' 2"  (1.88 m)   Wt 99.8 kg   SpO2 100%   BMI 28.25 kg/m   Physical Exam  Constitutional: He appears well-developed and well-nourished.  HENT:  Head: Normocephalic.  No wound visible where  patient reports noticing the two small "punctures".  Sensation is intact over area.    Eyes: Conjunctivae are normal.  Neck: Neck supple.  Cardiovascular: Normal rate.   Pulmonary/Chest: Effort normal.  Abdominal: He exhibits no distension.  Musculoskeletal: Normal range of motion.  Neurological: He is alert.  Psychiatric: He has a normal mood and affect.  Nursing note and vitals reviewed.  ED Treatments / Results  Labs (all labs ordered are listed, but only abnormal results are displayed) Labs Reviewed - No data to display  EKG  EKG Interpretation None         Radiology No results found.  Procedures Procedures (including critical care time)  DIAGNOSTIC STUDIES: Oxygen Saturation is 100% on RA, normal by my interpretation.    COORDINATION OF CARE: 6:51 PM Discussed treatment plan with pt at bedside and pt agreed to plan.  Medications Ordered in ED Medications - No data to display   Initial Impression / Assessment and Plan / ED Course  I have reviewed the triage vital signs and the nursing notes.  Pertinent labs & imaging results that were available during my care of the patient were reviewed by me and considered in my medical decision making (see chart for details).   Given patient noticed the wound after shaving, was not outside the night before, and has no history of bat exposure there is no indication for rabies prophylaxis.  His wound has since healed fully and with out scaring.  There are no signs of infection. Patient became less concerned for potential bat bite when he realized he had not been outside the night before.  Patient was advised to use a light when hiking at night.    At this time there does not appear to be any evidence of an acute emergency medical condition and the patient appears stable for discharge with appropriate outpatient follow up.Diagnosis was discussed with patient who verbalizes understanding and is agreeable to discharge. Pt case discussed with Dr. Johnney Killian who agrees with my plan.      Final Clinical Impressions(s) / ED Diagnoses   Final diagnoses:  Open wound of face, initial encounter    New Prescriptions Discharge Medication List as of 11/19/2016  7:48 PM     I personally performed the services described in this documentation, which was scribed in my presence. The recorded information has been reviewed and is accurate.  {Add scribe attestation statement   Lorin Glass, [place] 11/19/16 1117    Charlesetta Shanks, MD 11/23/16 [phone]

## 2017-02-11 ENCOUNTER — Ambulatory Visit (INDEPENDENT_AMBULATORY_CARE_PROVIDER_SITE_OTHER): Payer: Self-pay | Admitting: Orthopedic Surgery

## 2017-09-25 ENCOUNTER — Encounter (INDEPENDENT_AMBULATORY_CARE_PROVIDER_SITE_OTHER): Payer: Self-pay | Admitting: Orthopedic Surgery

## 2017-09-25 ENCOUNTER — Ambulatory Visit (INDEPENDENT_AMBULATORY_CARE_PROVIDER_SITE_OTHER): Payer: 59

## 2017-09-25 ENCOUNTER — Ambulatory Visit (INDEPENDENT_AMBULATORY_CARE_PROVIDER_SITE_OTHER): Payer: 59 | Admitting: Orthopedic Surgery

## 2017-09-25 DIAGNOSIS — G8929 Other chronic pain: Secondary | ICD-10-CM

## 2017-09-25 DIAGNOSIS — M19012 Primary osteoarthritis, left shoulder: Secondary | ICD-10-CM | POA: Diagnosis not present

## 2017-09-25 DIAGNOSIS — M25512 Pain in left shoulder: Secondary | ICD-10-CM

## 2017-09-25 NOTE — Progress Notes (Signed)
Office Visit Note   Patient: George Gallegos           Date of Birth: November 27, 1960           MRN: [id number] Visit Date: 09/25/2017 Requested by: No referring provider defined for this encounter. PCP: Chesley Noon, MD  Subjective: Chief Complaint  Patient presents with  . 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  He is right-hand dominant.  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  He reports some decreased range of motion and pain around the superior aspect of the shoulder.  He had to push his body up off of the ground in November due to some back pain he was having.  Symptoms started at that time.  He is able to sleep on the left-hand side.  Overhead activity particularly abduction beyond 90 degrees is painful for [name].              ROS: All systems reviewed are negative as they relate to the chief complaint within the history of present illness.  Patient denies  fevers or chills.   Assessment & Plan: Visit Diagnoses:  1. Arthritis of left acromioclavicular joint   2. Chronic left shoulder pain     Plan: Impression is left shoulder pain which she localizes primarily to the anterolateral margin of the acromion as well as to the [facility] joint.  I think he may have a component of semi-traumatic bursitis as well as AC joint symptomatic arthritis and inflammation.  For now would like for him to try topical anti-inflammatory along with Duexis for 2 weeks.  If that does not help then I would favor ultrasound-guided [facility] joint injection along with subacromial injection.  His rotator cuff strength is excellent today so I do not think rotator cuff tearing is really a primary consideration at this time.  Follow-Up Instructions: Return if symptoms worsen or fail to improve.   Orders:  Orders Placed This Encounter  Procedures  . XR Shoulder Left   No orders of the defined types were placed in this encounter.      Procedures: No procedures performed   Clinical Data: No additional findings.  Objective: Vital Signs: There were no vitals taken for this visit.  Physical Exam:   Constitutional: Patient appears well-developed HEENT:  Head: Normocephalic Eyes:EOM are normal Neck: Normal range of motion Cardiovascular: Normal rate Pulmonary/chest: Effort normal Neurologic: Patient is alert Skin: Skin is warm Psychiatric: Patient has normal mood and affect    Ortho Exam: Orthopedic exam demonstrates full active and passive range of motion at left shoulder.  Rotator cuff strength is excellent to infraspinatus supraspinatus and subscap muscle testing.  No masses lymphadenopathy or skin changes noted in the shoulder girdle region.  He is more tender to [facility] joint palpation on the left compared to the right.  Impingement signs negative on the left.  O'Brien's testing negative.  No restriction of external rotation at 15 degrees of abduction.  No other masses lymphadenopathy or skin changes noted in that left shoulder girdle region.  Specialty Comments:  No specialty comments available.  Imaging: Xr Shoulder Left  Result Date: 09/25/2017 AP axillary outlet left shoulder reviewed.  Mild spurring is noted at the [facility] joint.  Glenohumeral joint is reduced without arthritis  . visualized lung fields clear.  No fracture or dislocation present    PMFS History: There are no active problems to  display for this patient.  Past Medical History:  Diagnosis Date  . Hypertension     History reviewed. No pertinent family history.  Past Surgical History:  Procedure Laterality Date  . HERNIA REPAIR     Social History   Occupational History  . Not on file  Tobacco Use  . Smoking status: Former Research scientist (life sciences)  . Smokeless tobacco: Never Used  Substance and Sexual Activity  . Alcohol use: Yes    Alcohol/week: 4.2 oz    Types: 7 Standard drinks or equivalent per week  . Drug use: No  . Sexual activity: Not on file

## 2018-01-04 ENCOUNTER — Emergency Department (HOSPITAL_COMMUNITY)
Admission: EM | Admit: 2018-01-04 | Discharge: 2018-01-05 | Disposition: A | Discharge status: Home / Self Care | Payer: 59 | Attending: Emergency Medicine | Admitting: Emergency Medicine

## 2018-01-04 ENCOUNTER — Encounter (HOSPITAL_COMMUNITY): Payer: Self-pay | Admitting: Emergency Medicine

## 2018-01-04 DIAGNOSIS — Z79899 Other long term (current) drug therapy: Secondary | ICD-10-CM | POA: Diagnosis not present

## 2018-01-04 DIAGNOSIS — Z87891 Personal history of nicotine dependence: Secondary | ICD-10-CM | POA: Insufficient documentation

## 2018-01-04 DIAGNOSIS — R319 Hematuria, unspecified: Secondary | ICD-10-CM | POA: Diagnosis present

## 2018-01-04 DIAGNOSIS — M545 Low back pain: Secondary | ICD-10-CM | POA: Diagnosis not present

## 2018-01-04 DIAGNOSIS — R310 Gross hematuria: Secondary | ICD-10-CM | POA: Insufficient documentation

## 2018-01-04 DIAGNOSIS — R030 Elevated blood-pressure reading, without diagnosis of hypertension: Secondary | ICD-10-CM

## 2018-01-04 DIAGNOSIS — I10 Essential (primary) hypertension: Secondary | ICD-10-CM | POA: Diagnosis not present

## 2018-01-04 DIAGNOSIS — N200 Calculus of kidney: Secondary | ICD-10-CM | POA: Insufficient documentation

## 2018-01-04 LAB — URINALYSIS, ROUTINE W REFLEX MICROSCOPIC
BILIRUBIN URINE: NEGATIVE
Bacteria, UA: NONE SEEN
Glucose, UA: NEGATIVE mg/dL
Ketones, ur: NEGATIVE mg/dL
Leukocytes, UA: NEGATIVE
Nitrite: NEGATIVE
PH: 6 (ref 5.0–8.0)
Protein, ur: NEGATIVE mg/dL
SPECIFIC GRAVITY, URINE: 1.005 (ref 1.005–1.030)

## 2018-01-04 NOTE — ED Triage Notes (Signed)
Pt reports that today used restroom and noticed blood in urine then started having lower back pains. Hx kidney stones.

## 2018-01-05 LAB — I-STAT CHEM 8, ED
BUN: 7 mg/dL (ref 6–20)
CHLORIDE: 101 mmol/L (ref 101–111)
Calcium, Ion: 1.16 mmol/L (ref 1.15–1.40)
Creatinine, Ser: 1 mg/dL (ref 0.61–1.24)
Glucose, Bld: 99 mg/dL (ref 65–99)
HCT: 46 % (ref 39.0–52.0)
Hemoglobin: 15.6 g/dL (ref 13.0–17.0)
POTASSIUM: 3.1 mmol/L — AB (ref 3.5–5.1)
SODIUM: 141 mmol/L (ref 135–145)
TCO2: 25 mmol/L (ref 22–32)

## 2018-01-05 LAB — CBC
HEMATOCRIT: 45 % (ref 39.0–52.0)
Hemoglobin: 16.2 g/dL (ref 13.0–17.0)
MCH: 31.1 pg (ref 26.0–34.0)
MCHC: 36 g/dL (ref 30.0–36.0)
MCV: 86.4 fL (ref 78.0–100.0)
Platelets: 178 10*3/uL (ref 150–400)
RBC: 5.21 MIL/uL (ref 4.22–5.81)
RDW: 12.4 % (ref 11.5–15.5)
WBC: 7.1 10*3/uL (ref 4.0–10.5)

## 2018-01-05 NOTE — ED Provider Notes (Signed)
[facility] DEPT Provider Note   CSN: [id number] Arrival date & time: 01/04/18  1638     History   Chief Complaint Chief Complaint  Patient presents with  . Hematuria  . Back Pain    HPI George Gallegos is a 57 y.o. male.  HPI 57 year old male comes in with chief complaint of bloody urine. Patient has history of hypertension and 2 episodes of kidney stones.  He states that this afternoon around 4 PM he had a sudden urge to go to the bathroom.  His initial urine was clear, but 45 minutes later he went to urinate again and at that time he had bloody urine.  Patient also noted some lower back pain.  Since then he is pretty much been pain-free.  Patient denies any nausea, vomiting, fevers, chills, bloody stools or diarrhea.  Patient is noted to be hypertensive.  He is not a smoker and denies any substance abuse or heavy alcohol use.   In the past when patient had kidney stones, on 1 of the episodes he had excruciating pain while the other time he had discomfort with urination and bloody urine, but no significant pain.  Past Medical History:  Diagnosis Date  . Hypertension     There are no active problems to display for this patient.   Past Surgical History:  Procedure Laterality Date  . HERNIA REPAIR          Home Medications    Prior to Admission medications   Medication Sig Start Date End Date Taking? Authorizing Provider  metoprolol (LOPRESSOR) 50 MG tablet Take 50 mg by mouth 2 (two) times daily.   Yes [provider]    Family History No family history on file.  Social History Social History   Tobacco Use  . Smoking status: Former Research scientist (life sciences)  . Smokeless tobacco: Never Used  Substance Use Topics  . Alcohol use: Yes    Alcohol/week: 4.2 oz    Types: 7 Standard drinks or equivalent per week  . Drug use: No     Allergies   Patient has no known allergies.   Review of Systems Review of Systems  Constitutional:  Positive for activity change.  Genitourinary: Positive for hematuria.  Allergic/Immunologic: Negative for immunocompromised state.  Hematological: Does not bruise/bleed easily.  All other systems reviewed and are negative.    Physical Exam Updated Vital Signs BP (!) 200/103 (BP Location: Right Arm)   Pulse 64   Temp 98 F (36.7 C) (Oral)   Resp 14   SpO2 100%   Physical Exam  Constitutional: He appears well-developed.  HENT:  Head: Atraumatic.  Neck: Neck supple.  Cardiovascular: Normal rate.  Pulmonary/Chest: Effort normal.  Abdominal: Soft. There is no tenderness.  Genitourinary:  Genitourinary Comments: No flank tenderness  Neurological: He is alert.  Skin: Skin is warm.  Nursing note and vitals reviewed.    ED Treatments / Results  Labs (all labs ordered are listed, but only abnormal results are displayed) Labs Reviewed  URINALYSIS, ROUTINE W REFLEX MICROSCOPIC - Abnormal; Notable for the following components:      Result Value   Color, Urine STRAW (*)    Hgb urine dipstick LARGE (*)    All other components within normal limits  I-STAT CHEM 8, ED - Abnormal; Notable for the following components:   Potassium 3.1 (*)    All other components within normal limits  CBC    EKG None  Radiology No results found.  Procedures Procedures (including critical care time)  Medications Ordered in ED Medications - No data to display   Initial Impression / Assessment and Plan / ED Course  I have reviewed the triage vital signs and the nursing notes.  Pertinent labs & imaging results that were available during my care of the patient were reviewed by me and considered in my medical decision making (see chart for details).     57 year old male comes in with chief complaint of hematuria with back pain.  He has no medical problems besides hypertension, but does have history of kidney stones x2 in the past.  At the moment patient is pain-free.   UA shows severe  hematuria, without any pyuria.  I suspect that patient is a kidney stone, however, given that patient is pain-free we also discussed other possibilities that could present with gross hematuria.  Patient has been informed that he does not have any risk factors for this conditions like renal cancer, bladder cancer -therefore if he is comfortable I do not see any reason for [place] to do additional work-up like a CT scan right now.  Patient was in agreement with the plan and will follow-up with his PCP.  Patient has been informed that given that we still think the etiology for his hematuria is stones, his symptoms can get severe, and if the pain gets unbearable he will have to come back to the ER.  Additionally, patient also noted to be hypertensive.  He thinks that elevated blood pressure was situational.  I-STAT Chem-8 shows normal creatinine.  CBC is also normal.  We will  Defer additional management to pcp. Final Clinical Impressions(s) / ED Diagnoses   Final diagnoses:  Gross hematuria  Nephrolithiasis  Elevated blood pressure reading    ED Discharge Orders    None       Varney Biles, MD 01/05/18 [phone]

## 2018-01-05 NOTE — Discharge Instructions (Addendum)
You are seen in the ER for bloody urine.  You had complained of slight pain on your back, and also mention previous history of kidney stones.  Given that you have no medical problems and the urine analysis shows no signs of infection, we suspect that your symptoms are likely because of kidney stone.  We advised that you follow-up with your primary care doctor for repeat evaluation.  If you no longer have pain but continued to have bloody urine, then they might have to do further diagnostic work-up.  We did not pursue a CT scan of your abdomen because you are currently pain-free. If the pain is unbearable, you start having fevers, chills, and are unable to keep any meds down - then return to the ER.

## 2021-01-04 ENCOUNTER — Other Ambulatory Visit: Payer: Self-pay

## 2021-01-04 ENCOUNTER — Ambulatory Visit (INDEPENDENT_AMBULATORY_CARE_PROVIDER_SITE_OTHER): Payer: 59 | Admitting: Family Medicine

## 2021-01-04 ENCOUNTER — Ambulatory Visit: Payer: Self-pay

## 2021-01-04 DIAGNOSIS — M542 Cervicalgia: Secondary | ICD-10-CM

## 2021-01-04 NOTE — Progress Notes (Signed)
   Office Visit Note   Patient: George Gallegos           Date of Birth: 06/29/1961           MRN: [id number] Visit Date: 01/04/2021 Requested by: Chesley Noon, MD [place],  [place] [address] PCP: Chesley Noon, MD  Subjective: Chief Complaint  Patient presents with  . Neck - Pain    Pain in the left shoulder and around the left scapula. Some pain into the index finger and thumb - some tingling. Chronic.    HPI: He is here with left-sided neck pain.  Chronic intermittent pain for at least 15 years.  Over the years he has done therapeutic exercises which have helped.  But in the past year he has developed atrophy of his deltoid muscles on the left.  He is able to maintain good strength by regularly training with weights.  He gets occasional tingling down his arm.  He is right-hand dominant.  He is not taking medication for his pain.  It is much worse when looking either upward or downward.              ROS:   All other systems were reviewed and are negative.  Objective: Vital Signs: There were no vitals taken for this visit.  Physical Exam:  General:  Alert and oriented, in no acute distress. Pulm:  Breathing unlabored. Psy:  Normal mood, congruent affect.  Neck: He has full range of motion but pain with looking upward and leaning to the left.  Spurling's test is positive on the left.  He has mild atrophy of his deltoid on the left compared to the right.  Upper extremity strength remains normal.  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   Assessment & Plan: 1.  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  Depending on the results, consider surgical consult at [facility].     Procedures: No procedures performed        PMFS History: There are no  problems to display for this patient.  Past Medical History:  Diagnosis Date  . Hypertension     No family history on file.  Past Surgical History:  Procedure Laterality Date  . HERNIA REPAIR     Social History   Occupational History  . Not on file  Tobacco Use  . Smoking status: Former Research scientist (life sciences)  . Smokeless tobacco: Never Used  Vaping Use  . Vaping Use: Never used  Substance and Sexual Activity  . Alcohol use: Yes    Alcohol/week: 7.0 standard drinks    Types: 7 Standard drinks or equivalent per week  . Drug use: No  . Sexual activity: Not on file

## 2021-05-17 ENCOUNTER — Other Ambulatory Visit: Payer: Self-pay | Admitting: Family Medicine

## 2021-05-17 ENCOUNTER — Other Ambulatory Visit: Payer: Self-pay | Admitting: Orthopaedic Surgery

## 2021-05-17 DIAGNOSIS — M542 Cervicalgia: Secondary | ICD-10-CM

## 2021-06-04 ENCOUNTER — Other Ambulatory Visit: Payer: 59

## 2021-08-06 ENCOUNTER — Ambulatory Visit (INDEPENDENT_AMBULATORY_CARE_PROVIDER_SITE_OTHER): Payer: 59 | Admitting: Gastroenterology

## 2021-08-06 ENCOUNTER — Encounter: Payer: Self-pay | Admitting: Gastroenterology

## 2021-08-06 VITALS — BP 180/102 | HR 80 | Ht 74.0 in | Wt 229.2 lb

## 2021-08-06 DIAGNOSIS — Z1211 Encounter for screening for malignant neoplasm of colon: Secondary | ICD-10-CM

## 2021-08-06 MED ORDER — PLENVU 140 G PO SOLR
ORAL | Status: DC
Start: 2021-08-06 — End: 2021-09-24

## 2021-08-06 MED ORDER — MICONAZOLE NITRATE 2 % EX CREA: 1.0000 "application " | TOPICAL_CREAM | Freq: Two times a day (BID) | CUTANEOUS | 0 refills | Status: DC

## 2021-08-06 NOTE — Progress Notes (Signed)
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 He has noticed a nodule in his rectum when he wipes after a bowel movement.  Denies any pain with defecation.  He has intermittent episodes of bright red blood per rectum when he wipes on the toilet paper.  He has perianal psoriasis and he feels the blood on the toilet paper is related to irritation of the perianal skin.  He has been using chronic steroid cream in the perianal area.  He also has psoriasis in his groin area.  Denies any abdominal pain or recent change in bowel habits.  No vomiting, decreased appetite or unintentional weight loss.  He never had colonoscopy or colorectal cancer screening.  No family history of colon cancer.   CT abdomen and pelvis 08/04/2010 1.  2 mm distal right ureteral calculus, with another 3 mm calculus  which has already passed into the urinary bladder.  Mild right  hydroureteronephrosis.  2.  Nonobstructing right nephrolithiasis.  3.  Diverticulosis.  No radiographic evidence of diverticulitis.   Outpatient Encounter Medications as of 08/06/2021  Medication Sig   metoprolol (LOPRESSOR) 50 MG tablet Take 50 mg by mouth 2 (two) times daily.   No facility-administered encounter medications on file as of 08/06/2021.    Allergies as of 08/06/2021   (No Known Allergies)    Past Medical History:  Diagnosis Date   Hypertension     Past Surgical History:  Procedure Laterality Date   HERNIA REPAIR      No family history on file.  Social History   Socioeconomic History   Marital status: Single    Spouse name: Not on file   Number of children: Not on file   Years of education: Not on file    Highest education level: Not on file  Occupational History   Not on file  Tobacco Use   Smoking status: Former   Smokeless tobacco: Never  Vaping Use   Vaping Use: Never used  Substance and Sexual Activity   Alcohol use: Yes    Alcohol/week: 7.0 standard drinks    Types: 7 Standard drinks or equivalent per week   Drug use: No   Sexual activity: Not on file  Other Topics Concern   Not on file  Social History Narrative   Not on file   Social Determinants of Health   Financial Resource Strain: Not on file  Food Insecurity: Not on file  Transportation Needs: Not on file  Physical Activity: Not on file  Stress: Not on file  Social Connections: Not on file  Intimate Partner Violence: Not on file      Review of systems: All other review of systems negative except as mentioned in the HPI.   Physical Exam: Vitals:   08/06/21 0819  BP: (!) 180/102  Pulse: 80  SpO2: 98%   Body mass index is 29.43 kg/m. Gen:      No acute distress HEENT:  sclera anicteric Abd:      soft, non-tender; no palpable masses, no distension Ext:    No edema Neuro: alert and oriented x 3 Psych: normal mood and affect Rectal exam: Normal anal sphincter tone,  no anal fissure , small external hemorrhoids skin tag.  Perianal area erythematous, no plaques or nodules Anoscopy: Small internal hemorrhoids, no active bleeding, normal dentate line, no visible nodules  Data Reviewed:  Reviewed labs, radiology imaging, old records and pertinent past GI work up   Assessment and Plan/Recommendations:  60 year old very pleasant gentleman with history of perianal psoriasis with complaints of perianal nodule.  No appreciable nodule on rectal exam. He is past due for colorectal cancer screening, will plan to schedule colonoscopy The risks and benefits as well as alternatives of endoscopic procedure(s) have been discussed and reviewed. All questions answered. The patient agrees to proceed.  Perianal area is  erythematous likely secondary to moisture buildup.  Advised patient to avoid using continuous steroid cream longer than 1 to 2 weeks at a time Use miconazole 2% twice daily for 4 to 6 weeks for tinea cruris  Benefiber 1 tablespoon 2-3 times daily with meals to improve bowel habits  Return after colonoscopy as needed   The patient was provided an opportunity to ask questions and all were answered. The patient agreed with the plan and demonstrated an understanding of the instructions.  Damaris Hippo , MD    CC: Chesley Noon, MD

## 2021-08-06 NOTE — Patient Instructions (Addendum)
You have been scheduled for a colonoscopy. Please follow written instructions given to you at your visit today.  Please pick up your prep supplies at the pharmacy within the next 1-3 days. If you use inhalers (even only as needed), please bring them with you on the day of your procedure.   AVOID moisture in the peri anal area   Use Benefiber 1 tablespoon twice daily   If you are age 62 or older, your body mass index should be between 23-30. Your Body mass index is 29.43 kg/m. If this is out of the aforementioned range listed, please consider follow up with your Primary Care Provider.  If you are age 97 or younger, your body mass index should be between 19-25. Your Body mass index is 29.43 kg/m. If this is out of the aformentioned range listed, please consider follow up with your Primary Care Provider.   ________________________________________________________  The [facility] providers would like to encourage you to use [facility] to communicate with providers for non-urgent requests or questions.  Due to long hold times on the telephone, sending your provider a message by [facility] may be a faster and more efficient way to get a response.  Please allow 48 business hours for a response.  Please remember that this is for non-urgent requests.  _______________________________________________________   Due to recent changes in healthcare laws, you may see the results of your imaging and laboratory studies on MyChart before your provider has had a chance to review them.  We understand that in some cases there may be results that are confusing or concerning to you. Not all laboratory results come back in the same time frame and the provider may be waiting for multiple results in order to interpret others.  Please give [place] 48 hours in order for your provider to thoroughly review all the results before contacting the office for clarification of your results.    Follow up as needed   I appreciate the   opportunity to care for you  Thank You   Harl Bowie , MD

## 2021-08-12 ENCOUNTER — Encounter: Payer: Self-pay | Admitting: Gastroenterology

## 2021-09-17 ENCOUNTER — Encounter: Payer: Self-pay | Admitting: Gastroenterology

## 2021-09-24 ENCOUNTER — Ambulatory Visit (AMBULATORY_SURGERY_CENTER): Payer: 59 | Admitting: Gastroenterology

## 2021-09-24 ENCOUNTER — Other Ambulatory Visit: Payer: Self-pay

## 2021-09-24 ENCOUNTER — Encounter: Payer: Self-pay | Admitting: Gastroenterology

## 2021-09-24 VITALS — BP 112/76 | HR 62 | Temp 98.7°F | Resp 19 | Ht 74.0 in | Wt 229.0 lb

## 2021-09-24 DIAGNOSIS — D123 Benign neoplasm of transverse colon: Secondary | ICD-10-CM

## 2021-09-24 DIAGNOSIS — Z1211 Encounter for screening for malignant neoplasm of colon: Secondary | ICD-10-CM

## 2021-09-24 DIAGNOSIS — D122 Benign neoplasm of ascending colon: Secondary | ICD-10-CM

## 2021-09-24 MED ORDER — SODIUM CHLORIDE 0.9 % IV SOLN: 500.0000 mL | INTRAVENOUS | Status: DC

## 2021-09-24 NOTE — Progress Notes (Signed)
Called to room to assist during endoscopic procedure.  Patient ID and intended procedure confirmed with present staff. Received instructions for my participation in the procedure from the performing physician.  

## 2021-09-24 NOTE — Op Note (Signed)
[facility] Patient Name: George Gallegos Procedure Date: 09/24/2021 10:24 AM MRN: [id number] Endoscopist: Mauri Pole , MD Age: 61 Referring MD:  Date of Birth: 07/13/1961 Gender: Male Account #: [id number] Procedure:                Colonoscopy Indications:              Screening for colorectal malignant neoplasm Medicines:                Monitored Anesthesia Care Procedure:                Pre-Anesthesia Assessment:                           - Prior to the procedure, a History and Physical                            was performed, and patient medications and                            allergies were reviewed. The patient's tolerance of                            previous anesthesia was also reviewed. The risks                            and benefits of the procedure and the sedation                            options and risks were discussed with the patient.                            All questions were answered, and informed consent                            was obtained. Prior Anticoagulants: The patient has                            taken no previous anticoagulant or antiplatelet                            agents. ASA Grade Assessment: II - A patient with                            mild systemic disease. After reviewing the risks                            and benefits, the patient was deemed in                            satisfactory condition to undergo the procedure.                           After obtaining informed consent, the colonoscope  was passed under direct vision. Throughout the                            procedure, the patient's blood pressure, pulse, and                            oxygen saturations were monitored continuously. The                            Olympus PCF-H190DL [id number]) Colonoscope was                            introduced through the anus and advanced to the the                            cecum,  identified by appendiceal orifice and                            ileocecal valve. The colonoscopy was performed                            without difficulty. The patient tolerated the                            procedure well. The quality of the bowel                            preparation was excellent. The ileocecal valve,                            appendiceal orifice, and rectum were photographed. Scope In: 10:36:19 AM Scope Out: 10:51:03 AM Scope Withdrawal Time: 0 hours 11 minutes 3 seconds  Total Procedure Duration: 0 hours 14 minutes 44 seconds  Findings:                 The perianal and digital rectal examinations were                            normal.                           Five sessile polyps were found in the transverse                            colon and ascending colon. The polyps were 6 to 12                            mm in size. These polyps were removed with a cold                            snare. Resection and retrieval were complete.                           Non-bleeding external and internal hemorrhoids were  found during retroflexion. The hemorrhoids were                            medium-sized. Complications:            No immediate complications. Estimated Blood Loss:     Estimated blood loss was minimal. Impression:               - Five 6 to 12 mm polyps in the transverse colon                            and in the ascending colon, removed with a cold                            snare. Resected and retrieved.                           - Non-bleeding external and internal hemorrhoids. Recommendation:           - Patient has a contact number available for                            emergencies. The signs and symptoms of potential                            delayed complications were discussed with the                            patient. Return to normal activities tomorrow.                            Written discharge instructions  were provided to the                            patient.                           - Resume previous diet.                           - Continue present medications.                           - Await pathology results.                           - Repeat colonoscopy in 3 years for surveillance                            based on pathology results.                           - Follow up in GI office for hemorrhoidal banding                            as needed Mauri Pole, MD 09/24/2021 10:59:41 AM This report has been signed electronically.

## 2021-09-24 NOTE — Progress Notes (Signed)
VSS, transported to PACU °

## 2021-09-24 NOTE — Patient Instructions (Signed)
Handouts given for polyps and hemorrhoids/banding.  Resume previous diet.  Continue present medications.  Await pathology results.   YOU HAD AN ENDOSCOPIC PROCEDURE TODAY AT [facility]:   Refer to the procedure report that was given to you for any specific questions about what was found during the examination.  If the procedure report does not answer your questions, please call your gastroenterologist to clarify.  If you requested that your care partner not be given the details of your procedure findings, then the procedure report has been included in a sealed envelope for you to review at your convenience later.  YOU SHOULD EXPECT: Some feelings of bloating in the abdomen. Passage of more gas than usual.  Walking can help get rid of the air that was put into your GI tract during the procedure and reduce the bloating. If you had a lower endoscopy (such as a colonoscopy or flexible sigmoidoscopy) you may notice spotting of blood in your stool or on the toilet paper. If you underwent a bowel prep for your procedure, you may not have a normal bowel movement for a few days.  Please Note:  You might notice some irritation and congestion in your nose or some drainage.  This is from the oxygen used during your procedure.  There is no need for concern and it should clear up in a day or so.  SYMPTOMS TO REPORT IMMEDIATELY:  Following lower endoscopy (colonoscopy or flexible sigmoidoscopy):  Excessive amounts of blood in the stool  Significant tenderness or worsening of abdominal pains  Swelling of the abdomen that is new, acute  Fever of 100F or higher  For urgent or emergent issues, a gastroenterologist can be reached at any hour by calling [phone]. Do not use MyChart messaging for urgent concerns.    DIET:  We do recommend a small meal at first, but then you may proceed to your regular diet.  Drink plenty of fluids but you should avoid alcoholic beverages for 24  hours.  ACTIVITY:  You should plan to take it easy for the rest of today and you should NOT DRIVE or use heavy machinery until tomorrow (because of the sedation medicines used during the test).    FOLLOW UP: Our staff will call the number listed on your records 48-72 hours following your procedure to check on you and address any questions or concerns that you may have regarding the information given to you following your procedure. If we do not reach you, we will leave a message.  We will attempt to reach you two times.  During this call, we will ask if you have developed any symptoms of COVID 19. If you develop any symptoms (ie: fever, flu-like symptoms, shortness of breath, cough etc.) before then, please call [phone].  If you test positive for Covid 19 in the 2 weeks post procedure, please call and report this information to [place].    If any biopsies were taken you will be contacted by phone or by letter within the next 1-3 weeks.  Please call us at [phone] if you have not heard about the biopsies in 3 weeks.    SIGNATURES/CONFIDENTIALITY: You and/or your care partner have signed paperwork which will be entered into your electronic medical record.  These signatures attest to the fact that that the information above on your After Visit Summary has been reviewed and is understood.  Full responsibility of the confidentiality of this discharge information lies with you and/or your care-partner.

## 2021-09-24 NOTE — Progress Notes (Signed)
[facility] History and Physical   Primary Care Physician:  Chesley Noon, MD   Reason for Procedure:  Colorectal cancer screening  Plan:    Screening colonoscopy with possible interventions as needed     HPI: George Gallegos is a very pleasant 61 y.o. male here for screening colonoscopy. Denies any nausea, vomiting, abdominal pain, melena or bright red blood per rectum  The risks and benefits as well as alternatives of endoscopic procedure(s) have been discussed and reviewed. All questions answered. The patient agrees to proceed.    Past Medical History:  Diagnosis Date   Anxiety    Hypertension    Psoriasis     Past Surgical History:  Procedure Laterality Date   HERNIA REPAIR      Prior to Admission medications   Medication Sig Start Date End Date Taking? Authorizing Provider  metoprolol (LOPRESSOR) 50 MG tablet Take 50 mg by mouth 2 (two) times daily.   Yes [provider]  miconazole (MICONAZOLE ANTIFUNGAL) 2 % cream Apply 1 application topically 2 (two) times daily. Use small pea sized amount perianal area for 4-6 weeks Patient not taking: Reported on 09/24/2021 08/06/21   Mauri Pole, MD    Current Outpatient Medications  Medication Sig Dispense Refill   metoprolol (LOPRESSOR) 50 MG tablet Take 50 mg by mouth 2 (two) times daily.     miconazole (MICONAZOLE ANTIFUNGAL) 2 % cream Apply 1 application topically 2 (two) times daily. Use small pea sized amount perianal area for 4-6 weeks (Patient not taking: Reported on 09/24/2021) 28.35 g 0   Current Facility-Administered Medications  Medication Dose Route Frequency Provider Last Rate Last Admin   0.9 %  sodium chloride infusion  500 mL Intravenous Continuous Phoenyx Paulsen, Venia Minks, MD        Allergies as of 09/24/2021   (No Known Allergies)    Family History  Problem Relation Age of Onset   Crohn's disease Father    Colon cancer Neg Hx    Esophageal cancer Neg Hx    Pancreatic  cancer Neg Hx    Stomach cancer Neg Hx    Rectal cancer Neg Hx     Social History   Socioeconomic History   Marital status: Single    Spouse name: Not on file   Number of children: Not on file   Years of education: Not on file   Highest education level: Not on file  Occupational History   Not on file  Tobacco Use   Smoking status: Former   Smokeless tobacco: Never  Vaping Use   Vaping Use: Never used  Substance and Sexual Activity   Alcohol use: Yes    Alcohol/week: 7.0 standard drinks    Types: 7 Standard drinks or equivalent per week   Drug use: No   Sexual activity: Not on file  Other Topics Concern   Not on file  Social History Narrative   Not on file   Social Determinants of Health   Financial Resource Strain: Not on file  Food Insecurity: Not on file  Transportation Needs: Not on file  Physical Activity: Not on file  Stress: Not on file  Social Connections: Not on file  Intimate Partner Violence: Not on file    Review of Systems:  All other review of systems negative except as mentioned in the HPI.  Physical Exam: Vital signs in last 24 hours: BP (!) 176/93    Pulse 75    Temp 98.7 F (37.1 C)  Ht 6\' 2"  (1.88 m)    Wt 229 lb (103.9 kg)    SpO2 97%    BMI 29.40 kg/m  General:   Alert, NAD Lungs:  Clear .   Heart:  Regular rate and rhythm Abdomen:  Soft, nontender and nondistended. Neuro/Psych:  Alert and cooperative. Normal mood and affect. A and O x 3  Reviewed labs, radiology imaging, old records and pertinent past GI work up  Patient is appropriate for planned procedure(s) and anesthesia in an ambulatory setting   K. Denzil Magnuson , MD [phone]

## 2021-09-26 ENCOUNTER — Telehealth: Payer: Self-pay

## 2021-09-26 NOTE — Telephone Encounter (Signed)
Attempted f/u call. No answer, left VM. 

## 2021-09-26 NOTE — Telephone Encounter (Signed)
Attempted f/u call. No answer on 2nd attempt.

## 2021-10-10 ENCOUNTER — Encounter: Payer: Self-pay | Admitting: Gastroenterology

## 2021-10-23 ENCOUNTER — Other Ambulatory Visit: Payer: 59

## 2021-11-26 ENCOUNTER — Ambulatory Visit
Admission: RE | Admit: 2021-11-26 | Discharge: 2021-11-26 | Disposition: A | Discharge status: Home / Self Care | Payer: 59 | Source: Ambulatory Visit | Attending: Family Medicine | Admitting: Family Medicine

## 2021-11-26 DIAGNOSIS — M542 Cervicalgia: Secondary | ICD-10-CM

## 2022-08-25 HISTORY — PX: CERVICAL FUSION: SHX112

## 2022-12-16 ENCOUNTER — Encounter: Payer: Self-pay | Admitting: Sports Medicine

## 2022-12-16 ENCOUNTER — Ambulatory Visit (INDEPENDENT_AMBULATORY_CARE_PROVIDER_SITE_OTHER): Payer: 59 | Admitting: Sports Medicine

## 2022-12-16 ENCOUNTER — Other Ambulatory Visit (INDEPENDENT_AMBULATORY_CARE_PROVIDER_SITE_OTHER): Payer: 59

## 2022-12-16 DIAGNOSIS — M5442 Lumbago with sciatica, left side: Secondary | ICD-10-CM | POA: Diagnosis not present

## 2022-12-16 DIAGNOSIS — M5136 Other intervertebral disc degeneration, lumbar region: Secondary | ICD-10-CM

## 2022-12-16 DIAGNOSIS — R29898 Other symptoms and signs involving the musculoskeletal system: Secondary | ICD-10-CM | POA: Diagnosis not present

## 2022-12-16 DIAGNOSIS — M5441 Lumbago with sciatica, right side: Secondary | ICD-10-CM | POA: Diagnosis not present

## 2022-12-16 DIAGNOSIS — G8929 Other chronic pain: Secondary | ICD-10-CM

## 2022-12-16 NOTE — Progress Notes (Signed)
George Gallegos - 62 y.o. male MRN [id number]  Date of birth: 1961/05/07  Office Visit Note: Visit Date: 12/16/2022 PCP: Eartha Inch, MD Referred by: Eartha Inch, MD  Subjective: Chief Complaint  Patient presents with   Lower Back - Pain   HPI: George Gallegos is a pleasant 62 y.o. male who presents today for acute on chronic low back pain.  Has had low back pain for several years, although recently had an exacerbation where he was working in his yard and felt a pull in the back.  Over the last few days to weeks he has noticed some weakness in both of his legs with cramping in the quads, his right side is worse than the left.  He does work as a Land, has received chiropractic care, has done stretching, home exercise/therapy in the past.  He has noticed weakness in both of his legs, although right is worse than left.  Pain comes from the lower spine but has been having cramping into the thighs, right worse than left.  He denies any fever or chills, no bowel or bladder incontinence.  He has been unable to take any anti-inflammatories or medication given that he is planned for a cervical fusion in 2 days with Dr. Lovell Sheehan.  Pertinent ROS were reviewed with the patient and found to be negative unless otherwise specified above in HPI.   Assessment & Plan: Visit Diagnoses:  1. Chronic midline low back pain with bilateral sciatica   2. DDD (degenerative disc disease), lumbar   3. Weakness of both lower extremities    Plan: Discussed with Onalee Hua the nature of his low back pain with radiculopathy.  He does have some degenerative disc disease at the L5-S1 level, but is getting more radicular symptoms in the right greater than left anterior thigh (L3-ish dermatome).  He has had on and off back pain for years that was always treated conservatively, but more worrisome is he is noting some weakness in the legs, right greater than left.  He denies any other red flag symptoms such as bowel  or bladder incontinence or weight loss.  X-rays do show a mild stepwise retrograde listhesis from the L3-L5 level, but given his chronicity of pain and the associated lower extremity weakness I feel it is pertinent to obtain urgent MRI to rule out cord compression or neural impingement.  Recommended heat, he will need to abstain from NSAIDs or prednisone given his upcoming cervical disc fusion, I will call him with results of MRI and if we need to consider medication following his procedure we can address at that time.  Follow-up: Will call with results of MRI   Meds & Orders: No orders of the defined types were placed in this encounter.   Orders Placed This Encounter  Procedures   XR Lumbar Spine Complete     Procedures: No procedures performed      Clinical History: No specialty comments available.  He reports that he has quit smoking. He has never used smokeless tobacco. No results for input(s): "HGBA1C", "LABURIC" in the last 8760 hours.  Objective:    Physical Exam  Gen: Well-appearing, in no acute distress; non-toxic CV: Regular Rate. Well-perfused. Warm.  Resp: Breathing unlabored on room air; no wheezing. Psych: Fluid speech in conversation; appropriate affect; normal thought process Neuro: Sensation intact throughout. No gross coordination deficits.   Ortho Exam - Lumbar spine: No gross deformity or scoliosis.  There is mild TTP around the L5-S1 region  as well as notable lumbar paraspinal hypertonicity on the right side from L1-L4.  There is gingerly range of motion with flexion and extension, although no significant bony block. There is some worsening of pain with extension.  Positive modified slump's test on the right.  There is some mild weakness with right-sided hip flexion, full strength on the left.  Remainder of strength testing intact.  1+ Patellar and Achilles DTRs bilaterally  Imaging: XR Lumbar Spine Complete  Result Date: 12/16/2022 4 views of the lumbar spine  including AP, lateral and flexion/extension views were ordered and reviewed by myself.  X-rays demonstrate loss of intervertebral disc height at L5-S1 with moderate degenerative disc disease here.  There is some mild facet arthropathy at L5 region.  Very trace stepwise retrolisthesis at L3 on L4 and L4 on L5. No acute fracture noted.   Past Medical/Family/Surgical/Social History: Medications & Allergies reviewed per EMR, new medications updated. There are no problems to display for this patient.  Past Medical History:  Diagnosis Date   Anxiety    Hypertension    Psoriasis    Family History  Problem Relation Age of Onset   Crohn's disease Father    Colon cancer Neg Hx    Esophageal cancer Neg Hx    Pancreatic cancer Neg Hx    Stomach cancer Neg Hx    Rectal cancer Neg Hx    Past Surgical History:  Procedure Laterality Date   HERNIA REPAIR     Social History   Occupational History   Not on file  Tobacco Use   Smoking status: Former   Smokeless tobacco: Never  Vaping Use   Vaping Use: Never used  Substance and Sexual Activity   Alcohol use: Yes    Alcohol/week: 7.0 standard drinks of alcohol    Types: 7 Standard drinks or equivalent per week   Drug use: No   Sexual activity: Not on file

## 2022-12-16 NOTE — Progress Notes (Signed)
Low back pain for years Here recently working in yard, felt a pull Has weakness In both legs; right worse than left Denies OTC medication  Has cervical surgery in 2 days

## 2022-12-19 ENCOUNTER — Encounter: Payer: Self-pay | Admitting: Sports Medicine

## 2022-12-28 ENCOUNTER — Other Ambulatory Visit: Payer: 59

## 2023-01-06 ENCOUNTER — Ambulatory Visit
Admission: RE | Admit: 2023-01-06 | Discharge: 2023-01-06 | Disposition: A | Discharge status: Home / Self Care | Payer: 59 | Source: Ambulatory Visit | Attending: Sports Medicine | Admitting: Sports Medicine

## 2023-01-06 DIAGNOSIS — G8929 Other chronic pain: Secondary | ICD-10-CM

## 2023-01-06 DIAGNOSIS — R29898 Other symptoms and signs involving the musculoskeletal system: Secondary | ICD-10-CM

## 2024-02-10 ENCOUNTER — Ambulatory Visit (INDEPENDENT_AMBULATORY_CARE_PROVIDER_SITE_OTHER): Admitting: Sports Medicine

## 2024-02-10 DIAGNOSIS — G8929 Other chronic pain: Secondary | ICD-10-CM

## 2024-02-10 DIAGNOSIS — M5442 Lumbago with sciatica, left side: Secondary | ICD-10-CM | POA: Diagnosis not present

## 2024-02-10 DIAGNOSIS — M5441 Lumbago with sciatica, right side: Secondary | ICD-10-CM

## 2024-02-10 DIAGNOSIS — M51362 Other intervertebral disc degeneration, lumbar region with discogenic back pain and lower extremity pain: Secondary | ICD-10-CM

## 2024-02-10 MED ORDER — MELOXICAM 15 MG PO TABS: 15.0000 mg | ORAL_TABLET | Freq: Every day | ORAL | 0 refills | Status: DC

## 2024-02-10 NOTE — Progress Notes (Signed)
 George Gallegos - 63 y.o. male MRN [id number]  Date of birth: Jun 17, 1961  Office Visit Note: Visit Date: 02/10/2024 PCP: George Handsome, MD Referred by: George Handsome, MD  Subjective: Chief Complaint  Patient presents with   Lower Back - Pain   HPI: George Gallegos is a pleasant 63 y.o. male who presents today for acute on chronic low back pain with numbness and tingling in bilateral feet.  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  I did see him back last year when he had an exacerbation of his low back pain doing some yard work and he was feeling some weakness and cramping in the quads and on the right leg.  Following this, he had been doing really well in the last few months and was not having much back pain at all.  He is very active working out and has been doing low back and core stabilization exercises. He was able to spread and lay mulch at his house as well as laying tile/digging up clay at home without any issues.  However two exercises he noticed including Nordic track and Rower machine does feel may have exacerbated his symptoms.  He has had low back pain but also more so bothering numbness and tingling that he describes as pin pricks in the feet from the heels down to the forefoot, this is worse on the left but he does get symptoms in the right.  He has had this difficulty for about 3 months or more. He has not been taking any medication.   Pertinent ROS were reviewed with the patient and found to be negative unless otherwise specified above in HPI.   Assessment & Plan: Visit Diagnoses:  1. Chronic midline low back pain with bilateral sciatica   2. Degeneration of intervertebral disc of lumbar region with discogenic back pain and lower extremity pain    Plan: Impression is acute on chronic low back pain with bilateral radiculopathy with numbness tingling in the feet (left > right).  We did review his previous x-rays and MRI which shows most notable  DDD with narrowing at the L5-S1 level as well as multiple small posterior disc bulges in the lumbar spine.  His radicular symptoms seem to be fitting more of an L5/S1 dermatome.  He has no strength deficits and his exam otherwise is reassuring, so I am more suspicious for neural irritation versus true impingement or compression.  Given this, we discussed short course of oral prednisone versus anti-inflammatory.  We will start him on a short course of meloxicam 15 mg for 2 weeks scheduled and then may transition to as needed.  He will notify me in about 2-3 weeks to see how he is doing.  If for some reason his symptoms are not improving, could consider very low-dose of oral prednisone or referral to Dr. Daisey Gallegos for one-time interlaminar injection for guided treatment (L5-S1). He will keep me updated in next few weeks.  Continue his home exercises, specifically McKenzie lumbar extension.  I would avoid heavy flexion-based exercises and his rowing machine in short-term.  Follow-up: Return if symptoms worsen or fail to improve.   Meds & Orders:  Meds ordered this encounter  Medications   meloxicam (MOBIC) 15 MG tablet    Sig: Take 1 tablet (15 mg total) by mouth daily.    Dispense:  21 tablet    Refill:  0   No orders of the defined types were placed in  this encounter.    Procedures: - IM injection 1 cc of 40mg  Methylprednisolone provided into right buttock      Clinical History: No specialty comments available.  He reports that he has quit smoking. He has never used smokeless tobacco. No results for input(s): HGBA1C, LABURIC in the last 8760 hours.  Objective:    Physical Exam  Gen: Well-appearing, in no acute distress; non-toxic CV: Well-perfused. Warm.  Resp: Breathing unlabored on room air; no wheezing. Psych: Fluid speech in conversation; appropriate affect; normal thought process  Ortho Exam - Lumbar: No notable scoliosis.  There is no midline spinous process TTP, there is some  mild fascial hypertonicity of the lower paraspinals bilaterally.  There is full range of motion with flexion and extension, very mild reproduction of pain with endrange extension. + SLR on the left at 60 degrees + and with active dorsiflexion, negative SLR on the right.  There is 5/5 strength in the L2-S1 nerve root bilaterally: *L1/L2: Hip Flexion & Abduction  *L3/L4: Knee Extension  *L4/L5: Ankle Dorsiflexion  *L5: Great Toe Extension  *S1: Ankle Plantar Flexion  Imaging:  MR Lumbar Spine w/o contrast CLINICAL DATA:  Low back and bilateral lower extremity weakness, right greater than.  EXAM: MRI LUMBAR SPINE WITHOUT CONTRAST  TECHNIQUE: Multiplanar, multisequence MR imaging of the lumbar spine was performed. No intravenous contrast was administered.  COMPARISON:  Lumbar radiographs 12/16/2022  FINDINGS: Segmentation: There are five lumbar type vertebral bodies. The last full intervertebral disc space is labeled L5-S1. This correlates with the lumbar radiographs  Alignment:  Normal  Vertebrae:  Normal marrow signal.  No bone lesions or fractures.  Conus medullaris and cauda equina: Conus extends to the bottom of T12 level. Conus and cauda equina appear normal.  Paraspinal and other soft tissues: No significant paraspinal or retroperitoneal findings. Simple renal cysts not requiring any further imaging evaluation.  Disc levels:  T12-L1: No significant findings.  L1-2: Small left paracentral disc extrusion with minimal impression on the ventral thecal sac. No spinal or foraminal stenosis. Mild facet disease.  L2-3: Very small central disc protrusion and mild bulging annulus with slight flattening of the ventral thecal sac and mild bilateral lateral recess encroachment. No foraminal stenosis.  L3-4: Mild annular bulge and mild facet disease with slight ligamentum flavum thickening contributing to mild bilateral lateral recess stenosis. No foraminal or significant  spinal stenosis.  L4-5: Bulging annulus, moderate facet disease ligamentum flavum thickening contributing to mild bilateral lateral recess stenosis, left greater than right. No significant spinal foraminal stenosis.  L5-S1: Degenerative disc disease with disc space narrowing and bulging annulus. There is also a very small central disc protrusion and moderate facet disease ligamentum flavum thickening contributing to mild bilateral lateral recess stenosis. No foraminal stenosis.  IMPRESSION: 1. Small left paracentral disc extrusion at L1-2. 2. Very small central disc protrusion at L2-3 with mild bilateral lateral recess encroachment. 3. Mild bilateral lateral recess stenosis at L3-4, L4-5 and L5-S1.  Electronically Signed   By: Marrian Siva M.D.   On: 01/06/2023 08:17  Past Medical/Family/Surgical/Social History: Medications & Allergies reviewed per EMR, new medications updated. There are no active problems to display for this patient.  Past Medical History:  Diagnosis Date   Anxiety    Hypertension    Psoriasis    Family History  Problem Relation Age of Onset   Crohn's disease Father    Colon cancer Neg Hx    Esophageal cancer Neg Hx    Pancreatic cancer  Neg Hx    Stomach cancer Neg Hx    Rectal cancer Neg Hx    Past Surgical History:  Procedure Laterality Date   HERNIA REPAIR     Social History   Occupational History   Not on file  Tobacco Use   Smoking status: Former   Smokeless tobacco: Never  Vaping Use   Vaping status: Never Used  Substance and Sexual Activity   Alcohol use: Yes    Alcohol/week: 7.0 standard drinks of alcohol    Types: 7 Standard drinks or equivalent per week   Drug use: No   Sexual activity: Not on file

## 2024-02-10 NOTE — Progress Notes (Signed)
 Patient says that he was doing really well until the beginning of March when his back pain returned. He says that he was using his new row machine, as well as a Equities trader, for about 10 minutes at a time for two weeks. He believes that the movement with the Nordic Track is what flared his pain, more so than the rower. He says that he has been doing all of the exercises that have previously helped in the past, but has not gotten any relief. He is primarily having pain in the low back, but will have symptoms through the legs, or lately more in the feet. He says that the numbness and pin pricks in the feet is worse on the left, although he does get these symptoms in the right.

## 2024-06-27 ENCOUNTER — Encounter: Payer: Self-pay | Admitting: Radiology

## 2024-07-13 ENCOUNTER — Encounter: Payer: Self-pay | Admitting: Gastroenterology

## 2024-08-30 ENCOUNTER — Ambulatory Visit

## 2024-08-30 VITALS — Ht 74.0 in | Wt 229.0 lb

## 2024-08-30 DIAGNOSIS — Z860100 Personal history of colon polyps, unspecified: Secondary | ICD-10-CM

## 2024-08-30 MED ORDER — NA SULFATE-K SULFATE-MG SULF 17.5-3.13-1.6 GM/177ML PO SOLN: 1.0000 | Freq: Once | ORAL | 0 refills | Status: AC

## 2024-08-30 NOTE — Progress Notes (Signed)
 RN confirmed patient name, date of birth, and address RN confirmed date and time of procedure RN reviewed and confirmed allergies  RN reviewed and updated current medications; confirmed preferred pharmacy Pt is not on diet pills nor GLP-1 medications Pt is not on blood thinners RN reviewed medical & surgical hx  Pt denies issues with constipation  Diabetic - no No A fib or A flutter No cardiac tests are pending  Pt is not on home 02  No issues known with past sedation with any surgeries or procedures Patient denies ever being told they had issues or difficulty with intubation  Patient unaware of any fh of malignant hyperthermia Ambulates independently RN reviewed prep instructions and explained time frames for holding certain medications RN answered patient questions; patient stated understanding Prep instructions sent

## 2024-09-01 ENCOUNTER — Encounter: Payer: Self-pay | Admitting: Gastroenterology

## 2024-09-09 ENCOUNTER — Encounter: Payer: Self-pay | Admitting: Gastroenterology

## 2024-09-09 ENCOUNTER — Ambulatory Visit: Admitting: Gastroenterology

## 2024-09-09 VITALS — BP 151/85 | HR 55 | Temp 97.7°F | Resp 10 | Ht 74.0 in | Wt 229.0 lb

## 2024-09-09 DIAGNOSIS — Z1211 Encounter for screening for malignant neoplasm of colon: Secondary | ICD-10-CM

## 2024-09-09 DIAGNOSIS — K644 Residual hemorrhoidal skin tags: Secondary | ICD-10-CM | POA: Diagnosis not present

## 2024-09-09 DIAGNOSIS — K648 Other hemorrhoids: Secondary | ICD-10-CM

## 2024-09-09 DIAGNOSIS — K5730 Diverticulosis of large intestine without perforation or abscess without bleeding: Secondary | ICD-10-CM

## 2024-09-09 DIAGNOSIS — Z860100 Personal history of colon polyps, unspecified: Secondary | ICD-10-CM

## 2024-09-09 DIAGNOSIS — Z860101 Personal history of adenomatous and serrated colon polyps: Secondary | ICD-10-CM | POA: Diagnosis not present

## 2024-09-09 MED ORDER — SODIUM CHLORIDE 0.9 % IV SOLN: 500.0000 mL | INTRAVENOUS | Status: DC

## 2024-09-09 NOTE — Op Note (Addendum)
 [facility] Patient Name: George Gallegos Procedure Date: 09/09/2024 8:28 AM MRN: [id number] Endoscopist: Gustav ALONSO Mcgee , MD, [id number] Age: 64 Referring MD:  Date of Birth: 03-11-1961 Gender: Male Account #: [id number] Procedure:                Colonoscopy Indications:              High risk colon cancer surveillance: Personal                            history of colonic polyps, High risk colon cancer                            surveillance: Personal history of adenoma (10 mm or                            greater in size), High risk colon cancer                            surveillance: Personal history of multiple (3 or                            more) adenomas Medicines:                Monitored Anesthesia Care Procedure:                Pre-Anesthesia Assessment:                           - Prior to the procedure, a History and Physical                            was performed, and patient medications and                            allergies were reviewed. The patient's tolerance of                            previous anesthesia was also reviewed. The risks                            and benefits of the procedure and the sedation                            options and risks were discussed with the patient.                            All questions were answered, and informed consent                            was obtained. Prior Anticoagulants: The patient has                            taken no anticoagulant or antiplatelet agents. ASA  Grade Assessment: II - A patient with mild systemic                            disease. After reviewing the risks and benefits,                            the patient was deemed in satisfactory condition to                            undergo the procedure.                           After obtaining informed consent, the colonoscope                            was passed under direct vision. Throughout the                             procedure, the patient's blood pressure, pulse, and                            oxygen saturations were monitored continuously. The                            Olympus Scope SN: [id number] was introduced through                            the anus and advanced to the the cecum, identified                            by appendiceal orifice and ileocecal valve. The                            colonoscopy was performed without difficulty. The                            patient tolerated the procedure well. The quality                            of the bowel preparation was good. The ileocecal                            valve, appendiceal orifice, and rectum were                            photographed. Scope In: 8:53:23 AM Scope Out: 9:02:08 AM Scope Withdrawal Time: 0 hours 6 minutes 48 seconds  Total Procedure Duration: 0 hours 8 minutes 45 seconds  Findings:                 The perianal and digital rectal examinations were                            normal.  A few small-mouthed diverticula were found in the                            sigmoid colon.                           Non-bleeding external and internal hemorrhoids were                            found during retroflexion. The hemorrhoids were                            small.                           The exam was otherwise without abnormality. Complications:            No immediate complications. Estimated Blood Loss:     Estimated blood loss was minimal. Impression:               - Diverticulosis in the sigmoid colon.                           - Non-bleeding external and internal hemorrhoids.                           - The examination was otherwise normal.                           - No specimens collected. Recommendation:           - Resume previous diet.                           - Continue present medications.                           - Repeat colonoscopy in 5 years for  surveillance. Tanveer Brammer V. Patricie Geeslin, MD 09/09/2024 9:10:20 AM This report has been signed electronically.

## 2024-09-09 NOTE — Progress Notes (Signed)
 [facility] History and Physical   Primary Care Physician:  Sophronia Ozell BROCKS, MD   Reason for Procedure:  History of adenomatous colon polyps  Plan:    Surveillance colonoscopy with possible interventions as needed     HPI: George Gallegos is a very pleasant 64 y.o. male here for surveillance colonoscopy. Denies any nausea, vomiting, abdominal pain, melena or bright red blood per rectum  The risks and benefits as well as alternatives of endoscopic procedure(s) have been discussed and reviewed.  The patient was provided an opportunity to ask questions and all were answered. The patient agreed with the plan and demonstrated an understanding of the instructions.   Past Medical History:  Diagnosis Date   Anxiety    Hypertension    Psoriasis     Past Surgical History:  Procedure Laterality Date   CERVICAL FUSION  2024   C5/C6   HERNIA REPAIR      Prior to Admission medications  Medication Sig Start Date End Date Taking? Authorizing Provider  losartan (COZAAR) 50 MG tablet Take 50 mg by mouth daily.   Yes [provider]  metoprolol tartrate (LOPRESSOR) 50 MG tablet Take 50 mg by mouth daily. 09/30/17  Yes [provider]    Current Outpatient Medications  Medication Sig Dispense Refill   losartan (COZAAR) 50 MG tablet Take 50 mg by mouth daily.     metoprolol tartrate (LOPRESSOR) 50 MG tablet Take 50 mg by mouth daily.     Current Facility-Administered Medications  Medication Dose Route Frequency Provider Last Rate Last Admin   0.9 %  sodium chloride  infusion  500 mL Intravenous Continuous Pax Reasoner V, MD        Allergies as of 09/09/2024   (No Known Allergies)    Family History  Problem Relation Age of Onset   Crohn's disease Father    Colon cancer Neg Hx    Esophageal cancer Neg Hx    Pancreatic cancer Neg Hx    Stomach cancer Neg Hx    Rectal cancer Neg Hx     Social History   Socioeconomic History   Marital status:  Single    Spouse name: Not on file   Number of children: Not on file   Years of education: Not on file   Highest education level: Not on file  Occupational History   Not on file  Tobacco Use   Smoking status: Former   Smokeless tobacco: Never  Vaping Use   Vaping status: Never Used  Substance and Sexual Activity   Alcohol use: Yes    Alcohol/week: 7.0 standard drinks of alcohol    Types: 7 Standard drinks or equivalent per week   Drug use: No   Sexual activity: Not on file  Other Topics Concern   Not on file  Social History Narrative   Not on file   Social Drivers of Health   Tobacco Use: Medium Risk (09/09/2024)   Patient History    Smoking Tobacco Use: Former    Smokeless Tobacco Use: Never    Passive Exposure: Not on file  Financial Resource Strain: Low Risk (01/05/2024)   Received from [facility]   Overall Financial Resource Strain (CARDIA)    Difficulty of Paying Living Expenses: Not hard at all  Food Insecurity: No Food Insecurity (01/05/2024)   Received from [facility]   Epic    Within the past 12 months, you worried that your food would run out before you got the money  to buy more.: Never true    Within the past 12 months, the food you bought just didn't last and you didn't have money to get more.: Never true  Transportation Needs: No Transportation Needs (01/05/2024)   Received from [facility]   PRAPARE - Transportation    Lack of Transportation (Medical): No    Lack of Transportation (Non-Medical): No  Physical Activity: Insufficiently Active (01/05/2024)   Received from [facility]   Exercise Vital Sign    On average, how many days per week do you engage in moderate to strenuous exercise (like a brisk walk)?: 4 days    On average, how many minutes do you engage in exercise at this level?: 30 min  Stress: Stress Concern Present (01/05/2024)   Received from [facility] - Occupational Stress Questionnaire     Feeling of Stress : Rather much  Social Connections: Somewhat Isolated (01/05/2024)   Received from [facility]   Social Network    How would you rate your social network (family, work, friends)?: Restricted participation with some degree of social isolation  Intimate Partner Violence: Not At Risk (01/05/2024)   Received from [facility]   HITS    Over the last 12 months how often did your partner physically hurt you?: Never    Over the last 12 months how often did your partner insult you or talk down to you?: Never    Over the last 12 months how often did your partner threaten you with physical harm?: Never    Over the last 12 months how often did your partner scream or curse at you?: Never  Depression (PHQ2-9): Not on file  Alcohol Screen: Not on file  Housing: Low Risk (01/05/2024)   Received from [facility]    In the last 12 months, was there a time when you were not able to pay the mortgage or rent on time?: No    In the past 12 months, how many times have you moved where you were living?: 0    At any time in the past 12 months, were you homeless or living in a shelter (including now)?: No  Utilities: Not At Risk (01/05/2024)   Received from [facility]    Threatened with loss of utilities: No  Health Literacy: Not on file    Review of Systems:  All other review of systems negative except as mentioned in the HPI.  Physical Exam: Vital signs in last 24 hours: BP (!) 207/118 Comment: CRNA Josh aware  Pulse 76   Temp 97.7 F (36.5 C) (Temporal)   Ht 6' 2 (1.88 m)   Wt 229 lb (103.9 kg)   SpO2 100%   BMI 29.40 kg/m  General:   Alert, NAD Lungs:  Clear .   Heart:  Regular rate and rhythm Abdomen:  Soft, nontender and nondistended. Neuro/Psych:  Alert and cooperative. Normal mood and affect. A and O x 3  Reviewed labs, radiology imaging, old records and pertinent past GI work up  Patient is appropriate for planned procedure(s) and  anesthesia in an ambulatory setting   K. Veena Coraima Tibbs , MD [phone]

## 2024-09-09 NOTE — Progress Notes (Signed)
 Report to PACU, RN, vss, BBS= Clear.

## 2024-09-09 NOTE — Patient Instructions (Addendum)
  Resume previous diet. Continue present medications. Repeat colonoscopy in 5 years for surveillance   YOU HAD AN ENDOSCOPIC PROCEDURE TODAY AT [place]:   Refer to the procedure report that was given to you for any specific questions about what was found during the examination.  If the procedure report does not answer your questions, please call your gastroenterologist to clarify.  If you requested that your care partner not be given the details of your procedure findings, then the procedure report has been included in a sealed envelope for you to review at your convenience later.  YOU SHOULD EXPECT: Some feelings of bloating in the abdomen. Passage of more gas than usual.  Walking can help get rid of the air that was put into your GI tract during the procedure and reduce the bloating. If you had a lower endoscopy (such as a colonoscopy or flexible sigmoidoscopy) you may notice spotting of blood in your stool or on the toilet paper. If you underwent a bowel prep for your procedure, you may not have a normal bowel movement for a few days.  Please Note:  You might notice some irritation and congestion in your nose or some drainage.  This is from the oxygen used during your procedure.  There is no need for concern and it should clear up in a day or so.  SYMPTOMS TO REPORT IMMEDIATELY:  Following lower endoscopy (colonoscopy or flexible sigmoidoscopy):  Excessive amounts of blood in the stool  Significant tenderness or worsening of abdominal pains  Swelling of the abdomen that is new, acute  Fever of 100F or higher  For urgent or emergent issues, a gastroenterologist can be reached at any hour by calling [phone]. Do not use MyChart messaging for urgent concerns.    DIET:  We do recommend a small meal at first, but then you may proceed to your regular diet.  Drink plenty of fluids but you should avoid alcoholic beverages for 24 hours.  ACTIVITY:  You should plan to  take it easy for the rest of today and you should NOT DRIVE or use heavy machinery until tomorrow (because of the sedation medicines used during the test).    FOLLOW UP: Our staff will call the number listed on your records the next business day following your procedure.  We will call around 7:15- 8:00 am to check on you and address any questions or concerns that you may have regarding the information given to you following your procedure. If we do not reach you, we will leave a message.     If any biopsies were taken you will be contacted by phone or by letter within the next 1-3 weeks.  Please call us at [phone] if you have not heard about the biopsies in 3 weeks.    SIGNATURES/CONFIDENTIALITY: You and/or your care partner have signed paperwork which will be entered into your electronic medical record.  These signatures attest to the fact that that the information above on your After Visit Summary has been reviewed and is understood.  Full responsibility of the confidentiality of this discharge information lies with you and/or your care-partner.

## 2024-09-09 NOTE — Progress Notes (Signed)
 Pt's states no medical or surgical changes since previsit or office visit.

## 2024-09-12 ENCOUNTER — Telehealth: Payer: Self-pay | Admitting: *Deleted

## 2024-09-12 NOTE — Telephone Encounter (Signed)
" °  Follow up Call-     09/09/2024    7:43 AM  Call back number  Post procedure Call Back phone  # [phone]  Permission to leave phone message Yes     Patient questions:  Do you have a fever, pain , or abdominal swelling? No. Pain Score  0 *  Have you tolerated food without any problems? Yes.    Have you been able to return to your normal activities? Yes.    Do you have any questions about your discharge instructions: Diet   No. Medications  No. Follow up visit  No.  Do you have questions or concerns about your Care? No.  Actions: * If pain score is 4 or above: No action needed, pain <4.   "
